# Patient Record
Sex: Female | Born: 1960 | Race: White | Hispanic: Yes | Marital: Married | Smoking: Never smoker
Health system: Southern US, Community
[De-identification: ages and names within clinical notes are randomized; demographics above are authoritative.]

## PROBLEM LIST (undated history)

## (undated) DIAGNOSIS — E7800 Pure hypercholesterolemia, unspecified: Secondary | ICD-10-CM

## (undated) DIAGNOSIS — E785 Hyperlipidemia, unspecified: Secondary | ICD-10-CM

## (undated) DIAGNOSIS — K5792 Diverticulitis of intestine, part unspecified, without perforation or abscess without bleeding: Secondary | ICD-10-CM

## (undated) DIAGNOSIS — I10 Essential (primary) hypertension: Secondary | ICD-10-CM

## (undated) DIAGNOSIS — K219 Gastro-esophageal reflux disease without esophagitis: Secondary | ICD-10-CM

## (undated) DIAGNOSIS — D219 Benign neoplasm of connective and other soft tissue, unspecified: Secondary | ICD-10-CM

## (undated) HISTORY — DX: Benign neoplasm of connective and other soft tissue, unspecified: D21.9

## (undated) HISTORY — PX: TUBAL LIGATION: SHX77

## (undated) HISTORY — DX: Gastro-esophageal reflux disease without esophagitis: K21.9

## (undated) HISTORY — DX: Essential (primary) hypertension: I10

## (undated) HISTORY — PX: HERNIA REPAIR: SHX51

---

## 1998-11-01 ENCOUNTER — Emergency Department (HOSPITAL_COMMUNITY): Admission: EM | Admit: 1998-11-01 | Discharge: 1998-11-01 | Payer: Self-pay | Admitting: Emergency Medicine

## 1999-02-04 ENCOUNTER — Emergency Department (HOSPITAL_COMMUNITY): Admission: EM | Admit: 1999-02-04 | Discharge: 1999-02-04 | Payer: Self-pay | Admitting: Emergency Medicine

## 1999-02-05 ENCOUNTER — Encounter: Payer: Self-pay | Admitting: Emergency Medicine

## 2001-09-12 ENCOUNTER — Ambulatory Visit (HOSPITAL_COMMUNITY): Admission: RE | Admit: 2001-09-12 | Discharge: 2001-09-12 | Payer: Self-pay | Admitting: Family Medicine

## 2002-07-01 ENCOUNTER — Encounter: Payer: Self-pay | Admitting: Internal Medicine

## 2002-07-01 ENCOUNTER — Ambulatory Visit (HOSPITAL_COMMUNITY): Admission: RE | Admit: 2002-07-01 | Discharge: 2002-07-01 | Payer: Self-pay | Admitting: Internal Medicine

## 2002-11-13 ENCOUNTER — Other Ambulatory Visit: Admission: RE | Admit: 2002-11-13 | Discharge: 2002-11-13 | Payer: Self-pay | Admitting: Family Medicine

## 2002-11-13 ENCOUNTER — Encounter: Admission: RE | Admit: 2002-11-13 | Discharge: 2002-11-13 | Payer: Self-pay | Admitting: Obstetrics and Gynecology

## 2002-11-13 ENCOUNTER — Encounter (INDEPENDENT_AMBULATORY_CARE_PROVIDER_SITE_OTHER): Payer: Self-pay | Admitting: Specialist

## 2003-01-02 ENCOUNTER — Encounter: Payer: Self-pay | Admitting: Internal Medicine

## 2003-01-02 ENCOUNTER — Ambulatory Visit (HOSPITAL_COMMUNITY): Admission: RE | Admit: 2003-01-02 | Discharge: 2003-01-02 | Payer: Self-pay | Admitting: Internal Medicine

## 2003-01-06 ENCOUNTER — Encounter: Payer: Self-pay | Admitting: Internal Medicine

## 2003-01-06 ENCOUNTER — Ambulatory Visit (HOSPITAL_COMMUNITY): Admission: RE | Admit: 2003-01-06 | Discharge: 2003-01-06 | Payer: Self-pay | Admitting: Internal Medicine

## 2003-02-12 ENCOUNTER — Other Ambulatory Visit: Admission: RE | Admit: 2003-02-12 | Discharge: 2003-02-12 | Payer: Self-pay | Admitting: *Deleted

## 2003-02-13 ENCOUNTER — Encounter: Admission: RE | Admit: 2003-02-13 | Discharge: 2003-02-13 | Payer: Self-pay | Admitting: *Deleted

## 2003-03-20 ENCOUNTER — Encounter: Admission: RE | Admit: 2003-03-20 | Discharge: 2003-03-20 | Payer: Self-pay | Admitting: Family Medicine

## 2003-08-07 ENCOUNTER — Encounter: Admission: RE | Admit: 2003-08-07 | Discharge: 2003-08-07 | Payer: Self-pay | Admitting: Family Medicine

## 2004-01-06 ENCOUNTER — Encounter: Admission: RE | Admit: 2004-01-06 | Discharge: 2004-01-06 | Payer: Self-pay | Admitting: Internal Medicine

## 2004-01-06 ENCOUNTER — Encounter (INDEPENDENT_AMBULATORY_CARE_PROVIDER_SITE_OTHER): Payer: Self-pay | Admitting: Specialist

## 2004-01-06 HISTORY — PX: BREAST BIOPSY: SHX20

## 2004-02-04 ENCOUNTER — Encounter: Admission: RE | Admit: 2004-02-04 | Discharge: 2004-02-04 | Payer: Self-pay | Admitting: Obstetrics and Gynecology

## 2004-02-17 ENCOUNTER — Ambulatory Visit (HOSPITAL_COMMUNITY): Admission: RE | Admit: 2004-02-17 | Discharge: 2004-02-17 | Payer: Self-pay | Admitting: Obstetrics and Gynecology

## 2004-03-04 ENCOUNTER — Ambulatory Visit (HOSPITAL_COMMUNITY): Admission: RE | Admit: 2004-03-04 | Discharge: 2004-03-04 | Payer: Self-pay | Admitting: Family Medicine

## 2005-06-09 ENCOUNTER — Ambulatory Visit: Payer: Self-pay | Admitting: Internal Medicine

## 2005-06-09 ENCOUNTER — Ambulatory Visit: Payer: Self-pay | Admitting: *Deleted

## 2005-06-29 ENCOUNTER — Encounter: Admission: RE | Admit: 2005-06-29 | Discharge: 2005-06-29 | Payer: Self-pay | Admitting: Internal Medicine

## 2006-07-05 ENCOUNTER — Emergency Department (HOSPITAL_COMMUNITY): Admission: EM | Admit: 2006-07-05 | Discharge: 2006-07-06 | Payer: Self-pay | Admitting: Emergency Medicine

## 2006-08-23 ENCOUNTER — Ambulatory Visit (HOSPITAL_COMMUNITY): Admission: RE | Admit: 2006-08-23 | Discharge: 2006-08-23 | Payer: Self-pay | Admitting: Surgery

## 2006-08-23 ENCOUNTER — Encounter (INDEPENDENT_AMBULATORY_CARE_PROVIDER_SITE_OTHER): Payer: Self-pay | Admitting: *Deleted

## 2008-01-08 ENCOUNTER — Encounter: Admission: RE | Admit: 2008-01-08 | Discharge: 2008-01-08 | Payer: Self-pay | Admitting: Sports Medicine

## 2008-01-17 ENCOUNTER — Encounter: Admission: RE | Admit: 2008-01-17 | Discharge: 2008-01-17 | Payer: Self-pay | Admitting: Geriatric Medicine

## 2008-06-23 ENCOUNTER — Encounter: Admission: RE | Admit: 2008-06-23 | Discharge: 2008-06-23 | Payer: Self-pay | Admitting: Geriatric Medicine

## 2009-06-04 ENCOUNTER — Encounter: Admission: RE | Admit: 2009-06-04 | Discharge: 2009-06-04 | Payer: Self-pay | Admitting: Geriatric Medicine

## 2010-12-24 ENCOUNTER — Encounter: Payer: Self-pay | Admitting: *Deleted

## 2010-12-25 ENCOUNTER — Encounter: Payer: Self-pay | Admitting: Internal Medicine

## 2010-12-25 ENCOUNTER — Encounter: Payer: Self-pay | Admitting: *Deleted

## 2010-12-26 ENCOUNTER — Encounter: Payer: Self-pay | Admitting: Geriatric Medicine

## 2011-04-21 NOTE — Group Therapy Note (Signed)
   NAME:  Sheila [name], Sheila [name] NO.:  [id number]   MEDICAL RECORD NO.:  [id number]                   PATIENT TYPE:  OUT   LOCATION:  [facility]                           FACILITY:  [facility]   PHYSICIAN:  Tinnie Gens, MD                     DATE OF BIRTH:  February 21, 1961   DATE OF SERVICE:  08/07/2003                                    CLINIC NOTE   CHIEF COMPLAINT:  Abnormal Pap.   HISTORY OF PRESENT ILLNESS:  The patient is a 50 year old Hispanic female  who has a history of ASCUS on a previous Pap with normal follow-up.  She  returns today for another six-month follow-up Pap.  On questioning, she does  have very irregular periods.  She had normal colpo biopsy and is being  followed with Paps every three months.  She has not had a mammogram this  year.   PHYSICAL EXAMINATION:  VITAL SIGNS:  Her blood pressure is 136/84, weight is  179.9.  GENITOURINARY:  She has normal external female genitalia.  The vagina is  rugated and clean.  The cervix is multiparous and without lesion.  On  bimanual exam the uterus is anteverted and 16-week size.  The adnexa are  without mass or tenderness.   IMPRESSION:  1. Abnormal Pap with normal follow-ups.  2. Health maintenance.   PLAN:  1. Pap smear today and six months.  2. Mammogram scheduled.                                               Tinnie Gens, MD    TP/MEDQ  D:  08/07/2003  T:  08/07/2003  Job:  [id number]

## 2011-04-21 NOTE — Op Note (Signed)
Sheila [name]    ACCOUNT NO.:  [id number]   MEDICAL RECORD NO.:  [id number]          PATIENT TYPE:  AMB   LOCATION:  DAY                          FACILITY:  [facility]   PHYSICIAN:  Sandria Bales. Ezzard Standing, M.D.  DATE OF BIRTH:  03/18/1961   DATE OF PROCEDURE:  08/23/2006  DATE OF DISCHARGE:                                 OPERATIVE REPORT   PREOPERATIVE DIAGNOSIS:  Umbilical hernia.   POSTOPERATIVE DIAGNOSIS:  Umbilical hernia, approximately 2 cm defect.   PROCEDURE:  Laparoscopic converted to open umbilical hernia repair.   SURGEON:  Sandria Bales. Ezzard Standing, M.D.   ASSISTANT:  None.   ANESTHESIA:  General with approximately 30 cc of 0.25% Marcaine.   COMPLICATIONS:  None.   INDICATIONS FOR PROCEDURE:  [name] is a 50 year old  Hispanic female who presented to the [facility] Emergency Room with  incarcerated umbilical hernia on July 05, 2006.  This hernia was reduced  and she was sent home, but she now returns for repair of her umbilical  hernia.  She is moderately obese.  It is a little bit hard to tell how large  this hernia is by CAT scan.  This hernia appeared large enough to  accommodate small bowel, which had incarcerated in it.  Therefore, I was  going to start laparoscopically, evaluate this hernia.  If the hernia  appeared to be 3 cm or bigger I would probably either do a laparoscopic or  ventral luxes.  If it is smaller I would probably do a primary repair.   Overall I explained to the patient through a translator, the indications and  potential complications.  The potential complications include:  bleeding,  infection and recurrence of the hernia.   OPERATIVE NOTE:  The patient was in the supine position with both arms  tucked; she was given 1 gram of Ancef at the initiation of the procedure.  The abdomen was prepped with Betadine solution and sterilely draped.   I accessed the abdominal cavity through the left upper quadrant, using an 11  mm  [employer] Optiview entered the abdominal cavity without difficulty.  Upon  abdominal exploration both lymph nodes and liver were unremarkable.  Stomach  was unremarkable.  I could see the umbilical hernia, it was fairly small  laparoscopically.  Therefore, I converted to an open procedure.   I removed the camera, desufflated the abdomen, entered through an  infraumbilical incision and cut down on a fairly large hernia sac (which  probably protruded some 5 or 6 cm), fouond the defect in the abdominal  fascia (was only about 1.5-2 cm in diameter).  I resected the sac, ligated  the base of the sac with 3-0 Vicryl suture; I then carried a repair of the  umbilical hernia with interrupted 0 Novofil sutures.   With the repair intact, I then re-laparoscoped the patient; there was no  evidence of air leak.  There was no evidence of bleeding.  I then  desufflated the abdomen and removed the trocar.   I then irrigated the abdomen, infiltrated approximately 30 cc of local  anesthetic -- both in the abdominal fascia and the left upper quadrant.  I  then reattached the umbilical skin to the fascia, enclosed the skin in  layers with 3-0 Vicryl suture and the skin with 5-0 Monocryl suture.  I  painted with tincture of Benzoin and Steri-Strips.   The patient tolerated the procedure well.  She was transported to the  recovery room in good condition.  Sponge and needle count were correct at  the end of the case.      Sandria Bales. Ezzard Standing, M.D.  Electronically Signed     DHN/MEDQ  D:  08/23/2006  T:  08/24/2006  Job:  [id number]

## 2011-04-21 NOTE — Consult Note (Signed)
Sheila Sheila [name], Sheila [name]   ACCOUNT NO.:  [id number]   MEDICAL RECORD NO.:  [id number]          PATIENT TYPE:  EMS   LOCATION:  [facility]                         FACILITY:  [facility]   PHYSICIAN:  Sheila Sheila [name], M.D.  DATE OF BIRTH:  [birth year]   DATE OF CONSULTATION:  DATE OF DISCHARGE:  07/06/2006                                   CONSULTATION   HISTORY OF ILLNESS:  This is a 50 year old Hispanic female who hails from  [place] and has no primary medical doctor and speaks very little English,  though in her room is her husband who actually understands and speaks  English very well.   She developed abdominal pain, came to the [facility] emergency room about  5:00 on August 2,2007 and was evaluated.  Apparently, there was some  confusion about what was causing actually her symptoms, but she underwent a  CT scan of her abdomen that showed an umbilical hernia with what appears to  a knuckle of bowel suggesting an incarceration.   Dr. Preston Fleeting called me at about 4:30 or 5:00.  I think that she was originally  seen by Dr. Carleene Cooper and then handed to Dr. Preston Fleeting, and Dr. Preston Fleeting called  me at 4:30 or 5:00 about her findings; however, he had been able to reduce  the umbilical hernia and she was feeling clearly better.  Of note, her white  blood count though which was first drawn at 8:00 on the 2nd of August was  17,800 with 91% neutrophils.   She denied any prior history of peptic ulcer disease, liver disease, colon  disease.  She had, had no prior abdominal surgery.   PAST MEDICAL HISTORY:  SHE HAS NO ALLERGIES.  She is on no medications.   REVIEW OF SYSTEMS:  As best I can tell from her husband,  NEUROLOGICALLY:  No history of seizures or loss of consciousness.  PULMONARY:  No history of pneumonia or tuberculosis.  CARDIAC:  No heart disease or chest pain.  GASTROINTESTINAL:  See history of present illness.  UROLOGIC:  No history of kidney stones or kidney infections.   She works in  [employer], I think maybe a [employer].   PHYSICAL EXAM:  VITAL SIGNS:  Her temperature is 97.4, pulse of 71, blood  pressure 105/64, respirations 18.  GENERAL:  She is a well-nourished, moderately obese Hispanic female.  Alert  and cooperative on physical exam.  HEENT:  Unremarkable.  NECK:  Supple.  I felt no mass or thyromegaly.  LUNGS:  Clear to auscultation.  HEART:  Regular rate and rhythm without murmur or rub.  ABDOMEN:  She has a defect at her umbilicus.  She may actually have some fat  cellulitis.  She seems to have about 2-3 cm defect at her umbilicus.  It was  a little hard to tell because she is obese, and she is a little bit tender  around the rim.  I feel no obvious incarcerated mass.  I think Dr. Preston Fleeting was  able to reduce this.  She has active bowel sounds.  She has no guarding or  rebound in any location.  I also do not feel  an inguinal hernia.  EXTREMITIES:  She has good strength in all four extremities.   LABS:  Her labs show a sodium of 137, potassium 4.6, chloride of 107.  Her  liver functions are normal.  Her lipase 21.  Her white blood count was  17,800.  We repeated that the morning of the 3rd.  It was about 18,000.  Her  hemoglobin was 12.9, hematocrit 38.3.  Her urine pregnancy was negative.   A CT scan that I reviewed with [facility] showed what does appear to be an  incarcerated hernia at the time of the CT scan.  She has no distended bowel  or evidence of bowel obstruction.  She also has what may be either a  gallbladder polyp or stone in her gallbladder, and she has what appears to  be a right inguinal hernia containing only fat.   IMPRESSION:  1. Umbilical hernia which has been reduced.  The patient is still tender      around the rim of the hernia and has no acute abdominal findings.  I am      assuming this white blood count may be due to the beat up bowel, but I      think it is probably okay to let her go home and set her up for       elective umbilical hernia repair.  I drew a diagram of the hernia for      the patient and her husband, again whom I though understood the surgery      well.   I discussed the indications and potential complications of surgery.  The  surgery may involve putting a piece of mesh in and usually is done as an  outpatient and can be set up in the next couple of weeks or if she has an  increase of her symptoms or problems.  She will be back in the emergency  room.  I did give her some Darvocet N100 for pain 20 tablets.  I wrote her a  note to be out of work for 3 days through the 7th of August.  I gave them  our phone number, and they are going to call our office and arrange to go by  to pick up a book on hernia repair and to get the hernia surgery scheduled.  1. Possible gallbladder polyp.  2. Right inguinal hernia  by CT only.  3. Moderately overweight.      Sheila Sheila [name], M.D.  Electronically Signed     DHN/MEDQ  D:  07/06/2006  T:  07/06/2006  Job:  [id number]

## 2011-04-21 NOTE — Group Therapy Note (Signed)
NAME:  Sheila [name], Sheila [name] NO.:  [id number]   MEDICAL RECORD NO.:  [id number]                   PATIENT TYPE:  OUT   LOCATION:  [facility]                           FACILITY:  [facility]   PHYSICIAN:  Silas Sacramento, MD                     DATE OF BIRTH:  1961-11-11   DATE OF SERVICE:  02/04/2004                                    CLINIC NOTE   CHIEF COMPLAINT:  A 30-month follow up Pap smear.   HISTORY OF PRESENT ILLNESS:  The patient is a 50 year old Hispanic female  history of ASCUS on previous Pap with a normal follow up returns for a 6-  month follow up Pap from September of 2004.  At that point in time Pap smear  was within normal limits.  Also of note since that time she had a mammogram  which by report showed a mass in her right breast which she subsequently had  biopsied in February 2005 which she states was fibrous in nature.  Also of  note, her last visit on bimanual exam uterus felt anteverted and at 16-week  size.   PHYSICAL EXAMINATION:  VITAL SIGNS:  As noted including blood pressure  139/89, pulse of 94.  GENERAL:  Alert Hispanic female, obese, in no acute distress.  ABDOMEN:  Soft, nontender, nondistended.  Fairly overweight.  GENITOURINARY:  Normal external female genitalia, vaginal mucosa is rugated  without lesions.  Cervix is multiparous without lesions or without cervical  motion tenderness.  A Pap smear is obtained.  Bimanual exam __________ ,  anteverted uterus and uterus appeared to be approximately 16 weeks size.  No  adnexal tenderness or apparent mass.   ASSESSMENT AND PLAN:  1. History of abnormal Pap smears with atypical squamous cells of     undetermined significance.  Also had a colposcopy in November 2003.  Has     had 2 normal Pap smears since that point in time.  Will obtain third Pap     smear today for further follow up.  2. Breast mass by report.  Apparently she has underwent mammography and     biopsy which was fibrous  tissue.  She is not being followed by [place] here.     Further workup per her other physician following this problem.  3. Enlarged uterus by bimanual exam.  By her report, she thinks she may have     had an ultrasound approximately a year ago.  Do not have a record of that     here today.  Will send her for a repeat ultrasound for other mass or     other cause of uterine enlargement.  Of note her menstrual history, she     does get 40 days in between each menses and is approximately 6 days in     duration with the first 3 days moderate flow, last 3 days light flow.  Follow up of this patient will be in approximately 2 to 4 weeks or sooner     as needed.  Of note her borderline blood pressure, she does have family     history.  We will recheck this next office visit.  Follow up as soon as     needed.                                               Silas Sacramento, MD    JG/MEDQ  D:  02/04/2004  T:  02/04/2004  Job:  [id number]

## 2012-04-11 ENCOUNTER — Other Ambulatory Visit: Payer: Self-pay | Admitting: Obstetrics and Gynecology

## 2012-04-11 DIAGNOSIS — Z1231 Encounter for screening mammogram for malignant neoplasm of breast: Secondary | ICD-10-CM

## 2012-04-17 ENCOUNTER — Ambulatory Visit (HOSPITAL_COMMUNITY): Payer: Self-pay | Attending: Obstetrics and Gynecology

## 2012-04-17 ENCOUNTER — Encounter: Payer: Self-pay | Admitting: Obstetrics and Gynecology

## 2012-04-17 ENCOUNTER — Other Ambulatory Visit (HOSPITAL_COMMUNITY)
Admission: RE | Admit: 2012-04-17 | Discharge: 2012-04-17 | Disposition: A | Discharge status: Home / Self Care | Payer: Self-pay | Source: Ambulatory Visit | Attending: Obstetrics and Gynecology | Admitting: Obstetrics and Gynecology

## 2012-04-17 ENCOUNTER — Ambulatory Visit (INDEPENDENT_AMBULATORY_CARE_PROVIDER_SITE_OTHER): Payer: Self-pay | Admitting: Obstetrics and Gynecology

## 2012-04-17 VITALS — BP 140/90 | HR 86 | Temp 98.1°F

## 2012-04-17 DIAGNOSIS — N939 Abnormal uterine and vaginal bleeding, unspecified: Secondary | ICD-10-CM | POA: Insufficient documentation

## 2012-04-17 DIAGNOSIS — I10 Essential (primary) hypertension: Secondary | ICD-10-CM | POA: Insufficient documentation

## 2012-04-17 DIAGNOSIS — N926 Irregular menstruation, unspecified: Secondary | ICD-10-CM

## 2012-04-17 NOTE — Progress Notes (Signed)
  Subjective:    Patient ID: Sheila [name], female    DOB: [birth year], 51 y.o.   MRN: [id number]  HPI  51 yo [id number] with LMP 03/29/2012 presenting today for evaluation of heavy vaginal bleeding. Patient reports an 52-month h/o of heavy vaginal bleeding. Patient reports menses being monthly and lasting 6 days previously but now they last 10-12 days. Patient is otherwise without complaints and denies CP, SOB, lightheadedness or dizziness. Patient reports that she was seen by a doctor (not within [facility]) and had blood work and an ultrasound performed which showed fibroid uterus.   Past Medical History  Diagnosis Date  . Hypertension   . Fibroids    Past Surgical History  Procedure Date  . Cesarean section   . Hernia repair   . Tubal ligation    Family History  Problem Relation Age of Onset  . Diabetes Sister   . Diabetes Brother   . Hypertension Sister    History  Substance Use Topics  . Smoking status: Never Smoker   . Smokeless tobacco: Never Used  . Alcohol Use: No     Review of Systems  All other systems reviewed and are negative.       Objective:   Physical Exam GENERAL: Well-developed, well-nourished female in no acute distress.  ABDOMEN: Soft, nontender, nondistended. No organomegaly. obese PELVIC: Normal external female genitalia. Vagina is pink and rugated.  Normal discharge. Normal appearing cervix. Bimanual exam limited due to body habitus. EXTREMITIES: No cyanosis, clubbing, or edema, 2+ distal pulses.     Assessment & Plan:  51 yo [phone] with abnormal uterine bleeding - Endometrial biopsy performed  ENDOMETRIAL BIOPSY     The indications for endometrial biopsy were reviewed.   Risks of the biopsy including cramping, bleeding, infection, uterine perforation, inadequate specimen and need for additional procedures  were discussed. The patient states she understands and agrees to undergo procedure today. Consent was signed. Time out was  performed. Urine HCG was negative. A sterile speculum was placed in the patient's vagina and the cervix was prepped with Betadine. A single-toothed tenaculum was placed on the anterior lip of the cervix to stabilize it. The uterine cavity was sounded to a depth of 9 cm using the uterine sound. The 3 mm pipelle was introduced into the endometrial cavity without difficulty, 2 passes were made.  A  moderate amount of tissue was  sent to pathology. The instruments were removed from the patient's vagina. Minimal bleeding from the cervix was noted. The patient tolerated the procedure well.  Routine post-procedure instructions were given to the patient. The patient will follow up in two weeks to review the results and for further management.   - Will obtain records from previous MD office - RTC in 2 weeks for results and further management

## 2012-05-17 ENCOUNTER — Ambulatory Visit (INDEPENDENT_AMBULATORY_CARE_PROVIDER_SITE_OTHER): Payer: Self-pay | Admitting: Advanced Practice Midwife

## 2012-05-17 ENCOUNTER — Encounter: Payer: Self-pay | Admitting: Advanced Practice Midwife

## 2012-05-17 VITALS — BP 123/80 | HR 90 | Temp 98.1°F | Ht 60.0 in | Wt 170.9 lb

## 2012-05-17 DIAGNOSIS — K5900 Constipation, unspecified: Secondary | ICD-10-CM

## 2012-05-17 DIAGNOSIS — R141 Gas pain: Secondary | ICD-10-CM

## 2012-05-17 DIAGNOSIS — N920 Excessive and frequent menstruation with regular cycle: Secondary | ICD-10-CM

## 2012-05-17 LAB — CBC
Hemoglobin: 11.7 g/dL — ABNORMAL LOW (ref 12.0–15.0)
MCH: 26.8 pg (ref 26.0–34.0)
MCV: 80.5 fL (ref 78.0–100.0)
RBC: 4.36 MIL/uL (ref 3.87–5.11)
WBC: 8.6 10*3/uL (ref 4.0–10.5)

## 2012-05-17 MED ORDER — MEDROXYPROGESTERONE ACETATE 10 MG PO TABS: 10.0000 mg | ORAL_TABLET | Freq: Every day | ORAL | Status: DC

## 2012-05-17 NOTE — Progress Notes (Signed)
  Subjective:    Patient ID: Sheila [name], female    DOB: 05-31-1961, 51 y.o.   MRN: [id number]  HPI: F/U visit for EBX results. No bleeding now. Also reports left mid abd pain, gradual onset, now 8/10. Feels like gas pains. Has not tried anything for Tx. Has had problems w/ constipation. Last BM today. Denies fever, chills, N/V/D. Normal appetite.     Review of Systems: Otherwise neg.     Objective:   Physical Exam:  BP 123/80  Pulse 90  Temp 98.1 F (36.7 C) (Oral)  Ht 5' (1.524 m)  Wt 77.52 kg (170 lb 14.4 oz)  BMI 33.38 kg/m2  LMP 04/28/2012 NAD Abd: Soft, NT, +BS x 4  EBX normal    Assessment & Plan:   1. Menorrhagia  medroxyPROGESTERone (PROVERA) 10 MG tablet, CBC  2. Constipation    3. Gas pain     May take Gas-X, Miralax until soft, daily BM's.  Discussed dietary changes to Tx and prevent constipation Discussed Tx options for menorrhagia. Will try Provera is Sx recur.  Bleeding precautions   Dorathy Kinsman, CNM 05/17/2012 12:07 PM

## 2012-05-17 NOTE — Patient Instructions (Signed)
Miralax for constipation. Gas_ for gas.  Constipacin en los adultos (Constipation in Adults) Se denomina constipacin al hecho de mover el intestino menos de dos veces por semana. Generalmente las heces son duras. A medida que envejecemos, la constipacin es ms frecuente. Si trata de solucionarlo con laxantes, puede empeorar el problema. Los laxantes utilizados durante largos perodos pueden [employer] msculos del colon. Esto har que la constipacin empeore gradualmente. Una dieta pobre en fibras, la ingesta insuficiente de lquidos y algunos medicamentos pueden empeorar el problema. ALGUNOS MEDICAMENTOS QUE PUEDEN CAUSAR CONSTIPACIN SON:  Diurticos.   Boqueadotes de los canales de calcio (medicamento utilizado para Chief Operating Officer la presin arterial y el funcionamiento cardaco.   Narcticos (ciertos medicamentos para Chief Technology Officer).   Anticolinrgicos.   Antiinflamatorios.   Anticidos que contengan aluminio.  ALGUNOS MEDICAMENTOS QUE PUEDEN CAUSAR CONSTIPACIN SON:   Diabetes.   Enfermedad de Parkinson.   Demencia (la mente no funciona adecuadamente y existen trastornos del pensamiento).   Ictus.   Depresin.   Otras enfermedades que causan dificultades con el metabolismo de sales y lquidos.  INSTRUCCIONES PARA EL CUIDADO DOMICILIARIO  El mejor tratamiento para la constipacin es el que se realiza sin tomar medicamentos. Es importante consumir ms fibras, frutas y Sports administrator.   Aumente lentamente el consumo de fibras a 25 a 38 gramos por da. Granos integrales, frutas, verduras y legumbres son buenas fuentes de [place]. Un dietista podr ayudarlo a incorporar alimentos altos en fibra en su dieta.   Beba gran cantidad de lquido para mantener la orina de tono claro o color amarillo plido.   Deber aadir un suplemento de fibra en su dieta si no puede recibir la cantidad suficiente a partir de los alimentos.   Aumentar la actividad fsica tambin ayuda a mejorar la  regularidad.   Los supositorios, segn lo haya indicado el mdico, podrn ser de utilidad. Si toma anticidos u otros productos que contengan aluminio o calcio, los que pueden causar constipacin, ser de gran ayuda cambiarlos por productos que contengan magnesio, con la aprobacin del mdico.   Si en el da de hoy le han aplicado un enema, considere que esta slo es una medida temporaria y no debe considerarse como tratamiento para la constipacin de Set designer data (crnica). Si utiliza enemas [employer], se debilitarn los msculos del colon y la Quarry manager.   Tambin deben evitarse en lo posible medidas ms enrgicas, como el consumo de sulfato de magnesio, siempre que sea posible. El sulfato de magnesio puede causar diarrea incontrolable. Sin embargo, si usted es una persona de edad [place], estas medidas pueden ser tentadoras. En algunos casos, este tipo de medidas ni siquiera le dan tiempo para llegar al bao.  SOLICITE ATENCIN MDICA DE INMEDIATO SI:  Observa sangre de color rojo brillante en las heces.   El estreimiento persiste durante ms de [employer]   Presenta dolor abdominal o rectal junto con el estreimiento.   No parece sentirse mejor.   Tiene preguntas para formular, o alguna preocupacin, o no mejora.  EST SEGURO QUE:   Comprende las instrucciones para el alta mdica.   Controlar su enfermedad.   Solicitar atencin mdica de inmediato segn las indicaciones.  Document Released: 12/10/2007 Document Revised: 11/09/2011 [facility] Patient Information 2012 [place], [place].  Menopausia (Menopause) La menopausia es el momento normal de la vida en que los perodos menstruales cesan completamente. Se considera definitiva cuando no ha habido perodos durante 12 meses consecutivos. Generalmente ocurre [employer] 48 y los 55  aos, y el promedio son los 51 aos. En muy raras ocasiones se produce antes de los 40 aos. En [employer], los ovarios dejan de  producir hormonas femeninas, estrgenos y Education officer, museum. Esto puede causar sntomas indeseables y Engineer, maintenance. En algunos casos los sntomas pueden ocurrir entre 4 a 5 aos antes del comienzo de la menopausia. No hay relacin entre el uso de anticonceptivos orales, el nmero de hijos que ha tenido, la raza o la edad en que los perodos menstruales comenzaron [place]). Las mujeres muy fumadoras y las muy delgadas pueden desarrollarla precozmente. CAUSAS  Los ovarios dejan de producir hormonas femeninas, estrgenos y Education officer, museum.   Otras causas son:   [place] en la que se extirpan ambos ovarios.   Los ovarios dejan de funcionar por causa desconocida.   Tumores en la glndula pituitaria, ubicada en el cerebro.   Enfermedades que [employer] ovarios y la produccin de hormonas.   Tratamiento de radioterapia en el abdomen o en la pelvis.   Quimioterapia que [employer].  SNTOMAS  Sofocos.   Sudoracin nocturna.   Disminucin del impulso sexual.   Sequedad vaginal y disminucin del tamao de los rganos genitales, lo que causa relaciones sexuales dolorosas.   Sequedad de la piel y aparicin de Banker.   Cefaleas.   Cansancio.   Irritabilidad.   Problemas de memoria.   Aumento de [place].   Infecciones urinarias.   Crecimiento del vello en el rostro y [place].   Infertilidad.  Otros sintomas ms graves son:  Prdida de masa sea (osteoporosis), lo que causa fracturas de huesos.   Depresin.   Endurecimiento y Scientist, research (medical) de las arterias (aterosclerosis), lo que puede ocasionar infartos e ictus.  DIAGNSTICO  Cuando el perodo menstrual falta durante 12 meses corridos.   Exmenes fsicos   Estudios hormonales de [place].  TRATAMIENTO Hay muchas opciones de tratamiento y casi tantas preguntas como opciones existen. La decisin de tratar o no los cambios que trae la menopausia es una decisin que realiza el profesional de acuerdo con cada  Marketing executive. El mdico comentar el tratamiento con usted. Juntos pueden decidir que tratamiento ser el mejor, por ejemplo:  Tratamiento de reemplazo hormonal.   Tratamiento de los sntomas individuales con medicamentos (por ejemplo tranquilizantes para la depresin).   Hierbas que pueden ayudar en algunos sntomas especficos.   Psicoterapia con un psiquiatra o un psiclogo.   Terapia grupal.   No recibir tratamiento.  INSTRUCCIONES PARA EL CUIDADO DOMICILIARIO  Tome los medicamentos segn las indicaciones.   Descanse y duerma lo suficiente.   Practicar ejercicios con regularidad.   Consuma una dieta rica en calcio (buena para los [place]) y soja (acta como un estrgeno).   Evite las bebidas alcohlicas.   No fume.   El consumo de vitamina E puede ayudar en ciertos casos.   Si tiene sofocos, vstase en capas.   Tome suplementos de calcio y vitamina D para fortalecer los [place].   Puede usar cremas de venta libre para la sequedad vaginal.   En algunos casos es de gran ayuda la terapia grupal.   Tambin puede ser de utilidad la acupuntura.  SOLICITE ANTENCIN MDICA SI:  No est segura de estar cursando la menopausia.   Tiene los sntomas y necesita consejo y [place].   Tiene perodos menstruales despus de los 55 aos.   Tiene dolor durante las [employer] [place] [employer].   Est en la menopausia (no ha tenido perodos menstruales durante 12 meses) y  desarrolla una hemorragia vaginal.   Necesita ser derivada a un especialista (gineclogo, psiquiatra o psiclogo) para Pensions consultant.  SOLICITE ATENCIN MDICA DE INMEDIATO SI:  Sufre una depresin severa.   Tiene una hemorragia vaginal abundante.   Se ha cado y piensa que se ha roto un hueso.   Siente dolor al [employer].   Siente dolor en el pecho o en la pierna.   Siente latidos cardacos acelerados (palpitaciones)   Tiene dolores de cabeza intensos.   Desarrolla trastornos visuales.    Siente un bulto en el pecho.   Tiene dolor abdominal, o sufre una indigestin grave.  Document Released: 01/01/2007 Document Revised: 11/09/2011 [facility] Patient Information 2012 [place], [place].  Menorragia (Menorrhagia) La hemorragia uterina (sangrado del tero) disfuncional es diferente del perodo menstrual normal. Cuando los perodos son irregulares o hay ms hemorragia que lo habitual en usted, se denomina menorragia. La causa puede ser un desequilibrio hormonal, fsico o metablico u otros problemas. Es necesario Medical sales representative examen para que el profesional pueda tratar [employer] causas que son tratables. Si el problema contina, ser necesario realizar una dilatacin y curetaje. Este procedimiento [employer] en dilatar el cuello del tero (la apertura del tero o matriz), es decir, se lo estira para Technical brewer, y se raspa la superficie que tapiza el interior del tero. Un especialista (patlogo) examina en el microscopio el tejido que se retira para asegurarse que no haya nada preocupante que requiera un examen ms exhaustivo. INSTRUCCIONES PARA EL CUIDADO DOMICILIARIO  Si el profesional que la asiste le prescribe medicamentos, tmelos tal como se le indic. No cambie ni reemplace medicamentos sin consultarlo con Mining engineer.   Las hemorragias de larga duracin pueden tener como consecuencia un dficit de hierro. El profesional que la asiste podr prescribirle comprimidos de hierro. Esto ayuda a Scientific laboratory technician que el organismo pierde luego de una hemorragia abundante. Tome los medicamentos tal como se le indic. El hierro puede causarle estreimiento. Si esto es un problema, aumente el consumo de [place], frutas y [place].   No tome aspirina o medicamentos que la contengan desde una semana antes del perodo menstrual ni durante el mismo. La aspirina puede hacer que la hemorragia empeore.   Si necesita cambiar el apsito o el tampn mas de una vez cada 2 horas, permanezca en cama y  descanse todo lo posible hasta que la hemorragia se detenga.   Consuma alimentos balanceados. Coma alimentos ricos en hierro. Por ejemplo, verduras de [employer], carne, hgado, huevos y panes y Medical laboratory scientific officer de grano integral. No trate de perder peso hasta que la hemorragia anormal se detenga y los niveles de hierro en la sangre vuelvan a la normalidad.  SOLICITE ATENCIN MDICA SI:  Debe cambiar el apsito o el tampn ms de una vez cada hora.   Si tiene nuseas (ganas de vomitar), vmitos, mareos o diarrea mientras toma los medicamentos.   Tiene algn problema que pueda relacionarse con el medicamento que est tomando.  SOLICITE ATENCIN MDICA DE INMEDIATO SI:  Tiene fiebre.   Siente escalofros.   Sufre una hemorragia intensa o comienza a eliminar cogulos de [place].   Se siente mareado o sufre un desmayo.  EST SEGURO QUE:   Comprende las instrucciones para el alta mdica.   Controlar su enfermedad.   Solicitar atencin mdica de inmediato segn las indicaciones.  Document Released: 08/30/2005 Document Revised: 11/09/2011 [facility] Patient Information 2012 [place], [place].

## 2014-10-05 ENCOUNTER — Encounter: Payer: Self-pay | Admitting: Advanced Practice Midwife

## 2015-04-27 ENCOUNTER — Other Ambulatory Visit (HOSPITAL_COMMUNITY): Payer: Self-pay | Admitting: *Deleted

## 2015-04-27 DIAGNOSIS — N6310 Unspecified lump in the right breast, unspecified quadrant: Secondary | ICD-10-CM

## 2015-05-20 ENCOUNTER — Ambulatory Visit
Admission: RE | Admit: 2015-05-20 | Discharge: 2015-05-20 | Disposition: A | Discharge status: Home / Self Care | Payer: No Typology Code available for payment source | Source: Ambulatory Visit | Attending: Obstetrics and Gynecology | Admitting: Obstetrics and Gynecology

## 2015-05-20 ENCOUNTER — Encounter (HOSPITAL_COMMUNITY): Payer: Self-pay

## 2015-05-20 ENCOUNTER — Ambulatory Visit (HOSPITAL_COMMUNITY)
Admission: RE | Admit: 2015-05-20 | Discharge: 2015-05-20 | Disposition: A | Discharge status: Home / Self Care | Payer: No Typology Code available for payment source | Source: Ambulatory Visit | Attending: Obstetrics and Gynecology | Admitting: Obstetrics and Gynecology

## 2015-05-20 VITALS — BP 120/84 | Temp 98.4°F | Ht 62.0 in | Wt 181.0 lb

## 2015-05-20 DIAGNOSIS — Z1239 Encounter for other screening for malignant neoplasm of breast: Secondary | ICD-10-CM

## 2015-05-20 DIAGNOSIS — N6310 Unspecified lump in the right breast, unspecified quadrant: Secondary | ICD-10-CM

## 2015-05-20 DIAGNOSIS — N63 Unspecified lump in breast: Secondary | ICD-10-CM | POA: Insufficient documentation

## 2015-05-20 DIAGNOSIS — Z124 Encounter for screening for malignant neoplasm of cervix: Secondary | ICD-10-CM | POA: Insufficient documentation

## 2015-05-20 DIAGNOSIS — Z1231 Encounter for screening mammogram for malignant neoplasm of breast: Secondary | ICD-10-CM | POA: Insufficient documentation

## 2015-05-20 NOTE — Progress Notes (Signed)
CLINIC:  [facility] Passenger transport manager) Clinic  REASON FOR VISIT: Well-woman exam and diagnostic mammogram.    HISTORY OF PRESENT ILLNESS:  Ms. Shawgo is a 54 y.o. female who presents to the [facility] today for clinical breast exam. She reports a right breast lump x 8 years at the 10 o'clock position, near the nipple, that she reports is painful at times.  She denies any redness or nipple discharge from the right breast.  Her last pap smear was in 01/2015 and was negative.  She has no history of abnormal pap smears.   REVIEW OF SYSTEMS:  Denies any left breast pain, nodularity, nipple inversion, or nipple discharge bilaterally.  Right breast per HPI.   ALLERGIES: No Known Allergies  CURRENT MEDICATIONS:  Current Outpatient Prescriptions on File Prior to Encounter  Medication Sig Dispense Refill  . ibuprofen (ADVIL,MOTRIN) 200 MG tablet Take 200 mg by mouth every 6 (six) hours as needed.    Marland Kitchen lisinopril-hydrochlorothiazide (PRINZIDE,ZESTORETIC) 10-12.5 MG per tablet Take 1 tablet by mouth daily.    . medroxyPROGESTERone (PROVERA) 10 MG tablet Take 1 tablet (10 mg total) by mouth daily. 10 tablet 1   No current facility-administered medications on file prior to encounter.     PHYSICAL EXAM:  Vitals:  Filed Vitals:   05/20/15 1029  BP: 120/84  Temp: 98.4 F (36.9 C)   General: Well-nourished, well-appearing female in no acute distress.  She is unaccompanied in clinic today.  Rolena Infante, LPN and Tamsen Snider, Spanish language interpreter were present during physical exam for this patient.  Breasts: Bilateral breasts exposed and observed with patient standing (arms at side, arms on hips, arms on hips flexed forward, and arms over head).  No gross abnormalities including breast skin puckering or dimpling noted on observation.  Breasts symmetrical without evidence of skin redness, thickening, or peau d'orange appearance. No nipple retraction or nipple  discharge noted bilaterally.  No breast nodularity palpated in bilateral breasts.  Normal fibrocystic breast changes noted in bilateral breasts. Axillary lymph nodes: No axillary lymphadenopathy bilaterally.   GU: Exam deferred. Pap smear is up-to-date.  ASSESSMENT & PLAN:   1. Breast cancer screening: Ms. Picazo has no palpable breast abnormalities on her clinical breast exam today.  Likely the lump she has been feeling is normal fibrocystic changes or normal breast tissue.  She will receive her diagnostic mammogram as scheduled.  She will be contacted by the imaging center for results of the mammogram. She was given instructions and educational materials regarding breast self-awareness. Ms. Stewart is aware of this plan and agrees with it.    Ms. Vicars was encouraged to ask questions and all questions were answered to her satisfaction.    Mike Craze, NP [place]  [phone]

## 2015-06-03 ENCOUNTER — Ambulatory Visit: Payer: No Typology Code available for payment source

## 2015-06-03 ENCOUNTER — Other Ambulatory Visit: Payer: No Typology Code available for payment source

## 2015-06-03 ENCOUNTER — Telehealth: Payer: Self-pay

## 2015-06-03 NOTE — Telephone Encounter (Signed)
Interpreter Mallory Shirk called patient to find out why missed appointment. Patient stated that did not have transportation because only had one car. Patient stated that daughter had not returned with the car. Patient wanted to reschedule for Thursday, July 7 at 9:30AM.

## 2015-06-10 ENCOUNTER — Other Ambulatory Visit: Payer: Self-pay

## 2015-06-10 ENCOUNTER — Ambulatory Visit (HOSPITAL_BASED_OUTPATIENT_CLINIC_OR_DEPARTMENT_OTHER): Payer: Self-pay

## 2015-06-10 VITALS — BP 150/98 | HR 64 | Temp 98.3°F | Resp 18 | Ht 59.5 in | Wt 183.2 lb

## 2015-06-10 DIAGNOSIS — Z0000 Encounter for general adult medical examination without abnormal findings: Secondary | ICD-10-CM

## 2015-06-10 LAB — LIPID PANEL
Cholesterol: 239 mg/dL — ABNORMAL HIGH (ref 0–200)
HDL: 50 mg/dL (ref >=46)
LDL Cholesterol: 160 mg/dL — ABNORMAL HIGH (ref 0–99)
Total CHOL/HDL Ratio: 4.8 Ratio
Triglycerides: 145 mg/dL (ref <150)
VLDL: 29 mg/dL (ref 0–40)

## 2015-06-10 LAB — GLUCOSE (CC13): GLUCOSE: 93 mg/dL (ref 70–140)

## 2015-06-10 LAB — HEMOGLOBIN A1C
Hgb A1c MFr Bld: 6 % — ABNORMAL HIGH (ref <5.7)
Mean Plasma Glucose: 126 mg/dL — ABNORMAL HIGH (ref <117)

## 2015-06-10 NOTE — Progress Notes (Signed)
Patient is a new patient to the [facility] program and is currently a [facility] patient effective 05/20/2015 and a interpreter did not show up. Patient speaks a little english.   Clinical Measurements: Patient is 4 ft. 11 1/2 inches, weight 183.2 lbs, waist circumference 38 inches, and hip circumference 45.5 inches.   Medical History: Patient states that has no history of high cholesterol or diabetes. Per patient does have a history of hypertension. Per patient no diagnosed history of coronary heart disease, heart attack, heart failure, stroke/TIA, vascular disease or congenital heart defects.   Blood Pressure, Self-measurement: Patient states has not been told to check Blood pressure.  Nutrition Assessment: Patient stated that eats 3 to 4 fruits every day. Patient states she eats 4 servings of vegetables a day. Per patient states does eat 3 or more ounces of whole grains daily. Patient stated does eat two or more servings of fish weekly. Patient states she does not drink more than 36 ounces or 450 calories of beverages with added sugars weekly. Patient stated she does watch her salt intake. 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 Patient states that walks trails usually but has been too hot.Patient does around 1860 minutes of moderate exercise a week and rarely does any vigorous exercise.  Smoking Status: Patient has never smoked and is not exposed to smoke.   Quality of Life Assessment: In assessing patient's quality of life she stated that out of the past 30 days that she has felt her health was good all of them. Patient also stated that in the past 30 days that her mental health was not good including stress, depression and problems with emotions for all days. Patient said that was going through menopause and was stressed and emotional. Patient did state that out of the past 30 days she felt her physical or mental health had not  kept her from doing her usual activities including self-care, work or recreation.   Plan: Lab work will be done today including a lipid panel, blood glucose, and Hgb A1C. Will call lab results when they are finished. Will discuss risk reduction counseling when call results.Will work on reducing risk factors associated with high blood pressure.

## 2015-06-10 NOTE — Patient Instructions (Signed)
Discussed health assessment with patient. Informed patient that she would need to be followed up for  blood pressure. Will try and get appointment on a Friday afternoon. She will be called with results of lab work and we will then discussed any further follow up the patient needs and risk reduction counseling. Patient will implement behavior modifications to help lower BP. Patient verbalized understanding.

## 2015-06-15 ENCOUNTER — Telehealth: Payer: Self-pay

## 2015-06-15 NOTE — Telephone Encounter (Signed)
Not available Appears phones are out of service. Will try later.

## 2015-06-16 ENCOUNTER — Encounter (HOSPITAL_COMMUNITY): Payer: Self-pay | Admitting: Emergency Medicine

## 2015-06-16 ENCOUNTER — Emergency Department (INDEPENDENT_AMBULATORY_CARE_PROVIDER_SITE_OTHER)
Admission: EM | Admit: 2015-06-16 | Discharge: 2015-06-16 | Disposition: A | Discharge status: Home / Self Care | Payer: Self-pay | Source: Home / Self Care

## 2015-06-16 ENCOUNTER — Telehealth: Payer: Self-pay

## 2015-06-16 DIAGNOSIS — J302 Other seasonal allergic rhinitis: Secondary | ICD-10-CM

## 2015-06-16 DIAGNOSIS — R040 Epistaxis: Secondary | ICD-10-CM

## 2015-06-16 HISTORY — DX: Pure hypercholesterolemia, unspecified: E78.00

## 2015-06-16 MED ORDER — IPRATROPIUM BROMIDE 0.06 % NA SOLN: 2.0000 | Freq: Four times a day (QID) | NASAL | Status: DC

## 2015-06-16 NOTE — Telephone Encounter (Signed)
Husband called to ask about lab and doctors appointment because interpreter did not return to assist. Husband stated that did not have anything to write with. I asked would he like me to text it to them? He stated that would be great.I texted the following in spanish: cholesterol- 239, HDL- 50, LDL- 160, triglycerides - 145, Bld Glucose -93 and HBG-A1C - 6.0.  Informed patient that she had an appointment at [facility] on Friday, July 22 at 11 AM. Also, told patient that she needed to make appointment with me at [facility] about what to do about results.

## 2015-06-16 NOTE — ED Notes (Signed)
Pt states she feels like she has something in her throat and when she clears it she is bringing up a little blood.  She also reports having a nose bleed.  All this started today.  She also wanted to have her blood pressure checked.  Pt says she feels stressed and nervous.

## 2015-06-16 NOTE — Discharge Instructions (Signed)
Your symptoms are likely due to nasal irritation from chronic ongoing allergies and sinus congestion. Please start using nasal saline multiple times per day. Please start using it nightly allergy pill such as Zyrtec. Please use the nasal Atrovent as prescribed.

## 2015-06-16 NOTE — ED Provider Notes (Signed)
CSN: [id number]     Arrival date & time 06/16/15  1359 History   None    Chief Complaint  Patient presents with  . Epistaxis  . Hemoptysis   (Consider location/radiation/quality/duration/timing/severity/associated sxs/prior Treatment) HPI  Woke up this morning with a bloody nose. Bled for only  3-5 mninutes. Pressure w/ improvement. 4 mo ago developed bloody nose. No use of nasal inhalers. H/o snus "problems:" Denies CP, SOB, Palpitations, HA, syncopr.    Past Medical History  Diagnosis Date  . High cholesterol    Past Surgical History  Procedure Laterality Date  . Cesarean section    . Hernia repair     Family History  Problem Relation Age of Onset  . Hyperlipidemia Sister   . Hypertension Sister    History  Substance Use Topics  . Smoking status: Never Smoker   . Smokeless tobacco: Never Used  . Alcohol Use: No   OB History    No data available     Review of Systems Per HPI with all other pertinent systems negative.    Allergies  Acetaminophen  Home Medications   Prior to Admission medications   Not on File   BP 148/88 mmHg  Pulse 76  Temp(Src) 98.2 F (36.8 C) (Oral)  Resp 18  SpO2 100% Physical Exam Physical Exam  Constitutional: oriented to person, place, and time. appears well-developed and well-nourished. No distress.  HENT:  Boggy nasal turbinates, oropharyngeal cavity normal. TMs normal bilaterally.  Head: Normocephalic and atraumatic.  Eyes: EOMI. PERRL.  Neck: Normal range of motion.  Cardiovascular: RRR, no m/r/g, 2+ distal pulses,  Pulmonary/Chest: Effort normal and breath sounds normal. No respiratory distress.  Abdominal: Soft. Bowel sounds are normal. NonTTP, no distension.  Musculoskeletal: Normal range of motion. Non ttp, no effusion.  Neurological: alert and oriented to person, place, and time.  Skin: Skin is warm. No rash noted. non diaphoretic.  Psychiatric: normal mood and affect. behavior is normal. Judgment and thought  content normal.   ED Course  Procedures (including critical care time) Labs Review Labs Reviewed - No data to display  Imaging Review No results found.   MDM   1. Epistaxis   2. Seasonal allergies    Zyrtec, nasal saline, nasal Atrovent. No use of Flonase indicated at this point time due to current epistaxis. Patient to consider using this in the future.    Waldemar Dickens, MD 06/16/15 [phone]

## 2015-06-17 ENCOUNTER — Telehealth: Payer: Self-pay

## 2015-06-17 NOTE — Telephone Encounter (Addendum)
Called per interpreter Zenda Alpers to see if patient wanted health coaching. Patient signed up for 2 PM on July 29. Will do [facility].

## 2015-06-25 ENCOUNTER — Ambulatory Visit (INDEPENDENT_AMBULATORY_CARE_PROVIDER_SITE_OTHER): Payer: Self-pay | Admitting: Family Medicine

## 2015-06-25 ENCOUNTER — Encounter: Payer: Self-pay | Admitting: Family Medicine

## 2015-06-25 VITALS — BP 152/96 | HR 85 | Temp 98.3°F | Ht 59.0 in | Wt 180.5 lb

## 2015-06-25 DIAGNOSIS — R0981 Nasal congestion: Secondary | ICD-10-CM | POA: Insufficient documentation

## 2015-06-25 DIAGNOSIS — I10 Essential (primary) hypertension: Secondary | ICD-10-CM

## 2015-06-25 DIAGNOSIS — E785 Hyperlipidemia, unspecified: Secondary | ICD-10-CM | POA: Insufficient documentation

## 2015-06-25 DIAGNOSIS — R7309 Other abnormal glucose: Secondary | ICD-10-CM

## 2015-06-25 DIAGNOSIS — R7303 Prediabetes: Secondary | ICD-10-CM | POA: Insufficient documentation

## 2015-06-25 MED ORDER — ASPIRIN EC 81 MG PO TBEC: 81.0000 mg | DELAYED_RELEASE_TABLET | Freq: Every day | ORAL | Status: DC

## 2015-06-25 MED ORDER — FLUTICASONE PROPIONATE 50 MCG/ACT NA SUSP: 1.0000 | Freq: Every day | NASAL | Status: DC

## 2015-06-25 MED ORDER — LISINOPRIL 10 MG PO TABS: 10.0000 mg | ORAL_TABLET | Freq: Every day | ORAL | Status: DC

## 2015-06-25 MED ORDER — ATORVASTATIN CALCIUM 40 MG PO TABS
40.0000 mg | ORAL_TABLET | Freq: Every day | ORAL | Status: DC
Start: 2015-06-25 — End: 2016-02-29

## 2015-06-25 NOTE — Assessment & Plan Note (Addendum)
BP elevated today. Reports previously on lisinopril, but discontinued over a year ago and due to cough and told that she should control her blood pressure through diet and exercise.  Not been on medications in the past year.  - Start lisinopril 10 mg daily; cough, potentially related to sinus problems - if cough returns or worsens, will consider switching to ARB - f/u in 2 weeks

## 2015-06-25 NOTE — Assessment & Plan Note (Signed)
Start lipitor 40mg  qhs

## 2015-06-25 NOTE — Patient Instructions (Signed)
It was great seeing you today.    Take Lipitor 40mg  (1 pill) every night  Take Lisinopril 10mg  (1 pill) every daily   Please bring all your medications to every doctors visit  Sign up for My Chart to have easy access to your labs results, and communication with your Primary care physician.  Next Appointment  Please make an appointment with Dr Berkley Harvey in 2 weeks   I look forward to talking with you again at our next visit. If you have any questions or concerns before then, please call the clinic at [phone].  Take Care,   Dr Phill Myron  Diabetes mellitus tipo2 (Type 2 Diabetes Mellitus) La diabetes mellitus tipo2, generalmente denominada diabetes tipo2, es una enfermedad prolongada (crnica). En la diabetes tipo2, el pncreas no produce suficiente insulina (una hormona), las clulas son menos sensibles a la insulina que se produce (resistencia a la insulina), o ambos. Normalmente, la [employer] azcares de los alimentos a las clulas de los tejidos. Las clulas de los tejidos [employer] azcares para Dealer. La falta de insulina o la falta de una respuesta normal a la insulina hace que el exceso de azcar se acumule en la sangre en lugar de Location manager en las clulas de los tejidos. Como resultado, se producen niveles altos de Dispensing optician (hiperglucemia). El efecto de los niveles altos de azcar (glucosa) puede causar muchas complicaciones.  La diabetes tipo2 antes tambin se denominaba diabetes del [place], pero puede ocurrir a Hotel manager.  [place] persona tiene mayor predisposicin a desarrollar diabetes tipo 2 si alguien en su familia tiene la enfermedad y tambin tiene uno o ms de los siguientes factores de riesgo principales:  Sobrepeso.  Estilo de vida sedentario.  Una historia de consumo constante de alimentos de altas caloras. Mantener un peso saludable y realizar actividad fsica regular puede reducir la probabilidad  de desarrollar diabetes tipo 2. SNTOMAS  Una persona con diabetes tipo 2 no presenta sntomas en un principio. Los sntomas de la diabetes tipo 2 aparecen lentamente. Los sntomas son:  Aumento de la sed (polidipsia).  Aumento de la miccin (poliuria).  Orina con ms frecuencia durante la noche (nocturia).  Prdida de peso. La prdida de peso puede ser muy rpida.  Infecciones frecuentes y recurrentes.  Cansancio (fatiga).  Debilidad.  Cambios en la visin, como visin borrosa.  Olor a Medical illustrator.  Dolor abdominal.  Nuseas o vmitos.  Cortes o moretones que tardan en sanarse.  Hormigueo o adormecimiento de las manos y los pies. DIAGNSTICO Con frecuencia la diabetes tipo 2 no se diagnostica hasta que se presentan las complicaciones de la diabetes. La diabetes tipo 2 se diagnostica cuando los sntomas o las complicaciones se presentan y cuando aumentan los niveles de glucosa en la [place]. El nivel de glucosa en la sangre puede controlarse en uno o ms de los siguientes anlisis de sangre:  Medicin de glucosa en la sangre en [place]. No se le permitir comer durante al menos 8 horas antes de que se tome [place] de [place].  Pruebas al azar de glucosa en la sangre. El nivel de glucosa en la sangre se controla en cualquier momento del da sin importar el momento en que haya comido.  Prueba de A1c (hemoglobina glucosilada) Una prueba de A1c proporciona informacin sobre el control de la glucosa en la sangre durante los ltimos 3 meses.  Prueba de tolerancia a la glucosa oral (PTGO). La  glucosa en la sangre se mide despus de no haber comido (ayunas) durante dos horas y despus de beber una bebida que contenga glucosa. TRATAMIENTO   Usted puede necesitar administrarse insulina o medicamentos para la diabetes todos los das para [employer] niveles de glucosa en la sangre en el rango deseado.  Si [place] insulina, tal vez necesite ajustar la dosis segn los  carbohidratos que haya consumido en cada comida o colacin. El objetivo del tratamiento es mantener el nivel de azcar en la sangre previo a comer (glucosa preprandial) entre 17 y 130mg /dl. INSTRUCCIONES PARA EL CUIDADO EN EL HOGAR   Controle su nivel de hemoglobina A1c dos veces al ao.  Contrlese a diario Retail buyer de glucosa en la sangre segn las indicaciones de su mdico.  Supervise las cetonas en la orina cuando est enferma y segn las indicaciones de su [place] medicamento para la diabetes o adminstrese insulina segn las indicaciones de su mdico para Contractor nivel de glucosa en la sangre en el rango deseado.  Nunca se quede sin medicamento para la diabetes o sin insulina. Es necesario que la reciba [employer].  Si [place] insulina, tal vez deba ajustar la cantidad de insulina administrada segn los carbohidratos consumidos. Los hidratos de carbono pueden aumentar los niveles de glucosa en la sangre, pero deben incluirse en su dieta. Los hidratos de carbono aportan vitaminas, minerales y [place] que son Ardelia Mems parte esencial de una dieta saludable. Los hidratos de carbono se encuentran en frutas, verduras, cereales integrales, productos lcteos, legumbres y alimentos que contienen azcares aadidos.  Consuma alimentos saludables. Programe una cita con un nutricionista certificado para que lo ayude a Building services engineer de alimentacin adecuado para usted.  Baje de peso si es necesario.  Lleve una tarjeta de alerta mdica o use una pulsera o medalla de alerta mdica.  Lleve con usted una colacin de 15gramos de hidratos de carbono en todo momento para controlar los niveles bajos de glucosa en la sangre (hipoglucemia). Algunos ejemplos de colaciones de 15gramos de hidratos de carbono son los siguientes:  Tabletas de glucosa, 3 o 4.  Gel de glucosa, tubo de 15 gramos.  Pasas de uva, 2 cucharadas (24 gramos).  Caramelos de goma, 6.  Galletas de [place], 8.  Gaseosa  comn, 4onzas (155mililitros).  Pastillas de goma, 9.  Reconocer la hipoglucemia. La hipoglucemia se produce cuando los niveles de glucosa en la sangre son de 70 mg/dl o menos. El riesgo de hipoglucemia aumenta durante el ayuno o cuando se saltea las comidas, durante o despus de Optometrist ejercicio intenso y [place] duerme. Los sntomas de hipoglucemia son:  Temblores o sacudidas.  Disminucin de la capacidad de concentracin.  Sudoracin.  Aumento de la frecuencia cardaca.  Dolor de [place].  Sequedad en la boca.  Hambre.  Irritabilidad.  Ansiedad.  Sueo agitado.  Alteracin del habla o de la coordinacin.  Confusin.  Tratar la hipoglucemia rpidamente. Si usted est alerta y puede tragar con seguridad, siga la regla de 15/15 que consiste en:  [employer] 15 y 20gramos de glucosa de accin rpida o carbohidratos. Las opciones de accin rpida son un gel de glucosa, tabletas de glucosa, o 4 onzas (120 ml) de jugo de frutas, gaseosa comn, o leche baja en grasa.  Compruebe su nivel de glucosa en la sangre 15 minutos despus de tomar la glucosa.  Tome entre 15 y 35 gramos ms de glucosa si el nivel de glucosa en la sangre todava  es de 70mg /dl o inferior.  Ingiera una comida o una colacin en el lapso de 1 hora una vez que los niveles de glucosa en la sangre vuelven a la normalidad.  Est atento a si siente mucha sed u orina con mayor frecuencia, porque son signos tempranos de hiperglucemia. El reconocimiento temprano de la hiperglucemia permite un tratamiento oportuno. Trate la hiperglucemia segn le indic su mdico.  Haga, al menos, 111minutos de actividad fsica moderada a la semana, distribuidos en, por lo menos, 3das a la semana o como lo indique su mdico. Adems, debe realizar ejercicios de resistencia por lo menos 2veces a la semana o como lo indique su mdico. Trate de no permanecer inactivo durante ms de 36minutos seguidos.  Ajuste su medicamento y la  ingesta de alimentos, segn sea necesario, si inicia un nuevo ejercicio o deporte.  Siga su plan para los das de enfermedad cuando no puede comer o beber como de [place].  No consuma ningn producto que contenga tabaco, como cigarrillos, tabaco de Higher education careers adviser o Psychologist, sport and exercise. Si necesita ayuda para dejar de fumar, hable con el mdico.  Limite el consumo de alcohol a no ms de 1 medida por da en las mujeres no embarazadas y 2 medidas en los hombres. Debe beber alcohol solo mientras come. Hable con su mdico acerca de si el alcohol es seguro para usted. Informe a su mdico si bebe alcohol varias veces a la semana.  Concurra a todas las visitas de control como se lo haya indicado el mdico. Esto es importante.  Programe un examen de la vista poco despus del diagnstico de diabetes tipo 2 y luego anualmente.  Realice diariamente el cuidado de la piel y de los pies. Examine su piel y los pies diariamente para ver si tiene cortes, moretones, enrojecimiento, problemas en las uas, sangrado, ampollas o Pension scheme manager. Su mdico debe hacerle un examen de los pies una vez por ao.  Cepllese los dientes y encas por lo menos dos veces al da y use hilo dental al menos una vez por da. Concurra regularmente a las visitas de control con el dentista.  Comparta su plan de control de diabetes en el trabajo o en la escuela.  [place]. Se recomienda que las [employer] de 65aos con diabetes se apliquen la vacuna contra la neumona. En algunos casos, pueden administrarse dos inyecciones separadas. Pregntele al mdico si tiene la vacuna contra la neumona al da.  Aprenda a Engineer, maintenance (IT).  Obtenga la mayor cantidad posible de informacin sobre la diabetes y solicite ayuda siempre que sea necesario.  Busque programas de rehabilitacin y participe en ellos para mantener o mejorar su independencia y su calidad de vida. Solicite la derivacin a fisioterapia o terapia  ocupacional si se le [employer] o la mano, o tiene problemas para asearse, vestirse, comer, o durante la Mobile fsica. SOLICITE ATENCIN MDICA SI:   No puede comer alimentos o beber por ms de 6 horas.  Tuvo nuseas o ha vomitado durante ms de 6 horas.  Su nivel de glucosa en la sangre es mayor de 240 mg/dl.  Presenta algn cambio en el estado mental.  Desarrolla una enfermedad grave adicional.  Tuvo diarrea durante ms de 6 horas.  Ha estado enfermo o ha tenido fiebre durante un par de das y no mejora.  Siente dolor al practicar cualquier actividad fsica. SOLICITE ATENCIN MDICA DE INMEDIATO SI:  Tiene dificultad para respirar.  Tiene niveles de cetonas moderados a  altos. ASEGRESE DE QUE:  Comprende estas instrucciones.  Controlar su afeccin.  Recibir ayuda de inmediato si no mejora o si empeora. Document Released: 11/20/2005 Document Revised: 04/06/2014 [facility] Patient Information 2015 [place], [place]. This information is not intended to replace advice given to you by your health care provider. Make sure you discuss any questions you have with your health care provider.

## 2015-06-25 NOTE — Assessment & Plan Note (Signed)
Discussed following local provider diet and increasing exercise to promote weight loss - She will follow up with diabetes education classes through [facility] - defer starting diabetes medication at this time - start ASA 81mg  qd

## 2015-06-25 NOTE — Assessment & Plan Note (Signed)
Likely due to allergies versus possible silent GERD.  Likely cause, and ear fullness, Throat irritation in possibly cough - Start Flonase - Reassess in 2 weeks - Advised discontinuing OTC sinus medications as likely containing pseudoephedrine and contributing to hypertension

## 2015-06-25 NOTE — Progress Notes (Signed)
  Patient name: Sheila [name] MRN [id number]  Date of birth: [birth year]  CC & HPI:  Sheila [name] is a 54 y.o. female presenting today for preDM, HTN and HLD.   DIABETES  Lab Results  Component Value Date   HGBA1C 6.0* 06/10/2015    CHRONIC HYPERTENSION BP Readings from Last 3 Encounters:  06/25/15 152/96  06/10/15 150/98  05/20/15 120/84    Disease Monitoring  Chest pain: no   Dyspnea: no   Claudication: no  Medication Side Effects: not taking medication   HLD Lab Results  Component Value Date   LDLCALC 160* 06/10/2015   Sinus congestion - Reports sinus congestion, ear fullness, and throat irritation several months - Been using OTC sinus medications and recent started Flonase  ROS: See HPI  Medications & Allergies: Reviewed  Social History: Reviewed:   Objective Findings:  Vitals: BP 152/96 mmHg  Pulse 85  Temp(Src) 98.3 F (36.8 C) (Oral)  Ht 4\' 11"  (1.499 m)  Wt 180 lb 8 oz (81.874 kg)  BMI 36.44 kg/m2  Gen: NAD HEENT: Nasal turbinates swollen bilaterally; TMs clear bilaterally  CV: RRR w/o m/r/g, pulses +2 b/l Resp: CTAB w/ normal respiratory effort  Assessment & Plan:   Please See Problem Focused Assessment & Plan

## 2015-07-02 ENCOUNTER — Ambulatory Visit: Payer: No Typology Code available for payment source

## 2015-07-06 ENCOUNTER — Ambulatory Visit: Payer: No Typology Code available for payment source

## 2015-07-09 ENCOUNTER — Ambulatory Visit: Payer: No Typology Code available for payment source

## 2015-09-09 ENCOUNTER — Telehealth: Payer: Self-pay

## 2015-09-09 NOTE — Telephone Encounter (Signed)
Called and cleared up information and does not want any health coaching or BP monitoring at the present time. Will call us if needed and I will call back in 30 to 60 days.

## 2015-11-25 ENCOUNTER — Other Ambulatory Visit: Payer: Self-pay | Admitting: Family Medicine

## 2015-11-25 NOTE — Telephone Encounter (Signed)
Please call. Needs appointment to discuss HTN. I have refilled lisinopril medication for 1 mont but needs to be seen prior to additional refills.

## 2015-11-30 NOTE — Telephone Encounter (Signed)
Letter mailed to patient to contact our office to make an appt to discuss her blood pressure. Claron Rosencrans,CMA

## 2016-01-31 ENCOUNTER — Encounter: Payer: Self-pay | Admitting: Family Medicine

## 2016-01-31 ENCOUNTER — Ambulatory Visit (INDEPENDENT_AMBULATORY_CARE_PROVIDER_SITE_OTHER): Payer: Self-pay | Admitting: Family Medicine

## 2016-01-31 VITALS — BP 155/93 | HR 103 | Temp 99.0°F | Wt 182.0 lb

## 2016-01-31 DIAGNOSIS — R1010 Upper abdominal pain, unspecified: Secondary | ICD-10-CM | POA: Insufficient documentation

## 2016-01-31 DIAGNOSIS — R1032 Left lower quadrant pain: Secondary | ICD-10-CM

## 2016-01-31 DIAGNOSIS — R109 Unspecified abdominal pain: Secondary | ICD-10-CM | POA: Insufficient documentation

## 2016-01-31 LAB — COMPLETE METABOLIC PANEL WITH GFR
ALT: 14 U/L (ref 6–29)
AST: 12 U/L (ref 10–35)
Albumin: 4.2 g/dL (ref 3.6–5.1)
Alkaline Phosphatase: 105 U/L (ref 33–130)
BUN: 15 mg/dL (ref 7–25)
CHLORIDE: 103 mmol/L (ref 98–110)
CO2: 27 mmol/L (ref 20–31)
Calcium: 9.8 mg/dL (ref 8.6–10.4)
Creat: 0.59 mg/dL (ref 0.50–1.05)
GFR, Est African American: >89 mL/min (ref >=60)
GLUCOSE: 89 mg/dL (ref 65–99)
POTASSIUM: 4.1 mmol/L (ref 3.5–5.3)
SODIUM: 138 mmol/L (ref 135–146)
Total Bilirubin: 0.2 mg/dL (ref 0.2–1.2)
Total Protein: 7.6 g/dL (ref 6.1–8.1)

## 2016-01-31 LAB — CBC WITH DIFFERENTIAL/PLATELET
BASOS ABS: 0.1 10*3/uL (ref 0.0–0.1)
BASOS PCT: 1 % (ref 0–1)
Eosinophils Absolute: 0.4 10*3/uL (ref 0.0–0.7)
Eosinophils Relative: 4 % (ref 0–5)
HCT: 36.4 % (ref 36.0–46.0)
Hemoglobin: 12.1 g/dL (ref 12.0–15.0)
Lymphocytes Relative: 34 % (ref 12–46)
Lymphs Abs: 3.6 10*3/uL (ref 0.7–4.0)
MCH: 28.3 pg (ref 26.0–34.0)
MCHC: 33.2 g/dL (ref 30.0–36.0)
MCV: 85.2 fL (ref 78.0–100.0)
MPV: 10.3 fL (ref 8.6–12.4)
Monocytes Absolute: 0.5 10*3/uL (ref 0.1–1.0)
Monocytes Relative: 5 % (ref 3–12)
NEUTROS ABS: 5.9 10*3/uL (ref 1.7–7.7)
NEUTROS PCT: 56 % (ref 43–77)
Platelets: 477 10*3/uL — ABNORMAL HIGH (ref 150–400)
RBC: 4.27 MIL/uL (ref 3.87–5.11)
RDW: 15.4 % (ref 11.5–15.5)
WBC: 10.6 10*3/uL — ABNORMAL HIGH (ref 4.0–10.5)

## 2016-01-31 LAB — POCT URINALYSIS DIPSTICK
BILIRUBIN UA: NEGATIVE
Glucose, UA: NEGATIVE
KETONES UA: NEGATIVE
Nitrite, UA: NEGATIVE
PROTEIN UA: 100
Spec Grav, UA: 1.015
Urobilinogen, UA: 0.2
pH, UA: 6.5

## 2016-01-31 LAB — POCT UA - MICROSCOPIC ONLY

## 2016-01-31 NOTE — Patient Instructions (Signed)
It was great seeing you today.  Several things could be responsible for your abdominal pain: Possible hernia versus diverticulitis versus uterine fibroids.  We'll do some blood work and CT of her abdomen to further evaluate this.  I will call you with the results.  Continue ibuprofen 400-600 mg every 6 hours as needed for pain.    Please bring all your medications to every doctors visit  Sign up for My Chart to have easy access to your labs results, and communication with your Primary care physician.  Next Appointment  Please make an appointment with Dr Berkley Harvey in 1 week for abdominal pain   I look forward to talking with you again at our next visit. If you have any questions or concerns before then, please call the clinic at [phone].  Take Care,   Dr Phill Myron

## 2016-01-31 NOTE — Assessment & Plan Note (Signed)
Left-sided abdominal pain associated with menstrual bleeding that had not been present for several months.  Significant history of uterine fibroid, bilateral tubal ligation and incarcerated umbilical hernia status post repair.  Given the history of this previous incarcerated hernia;  Hernia is the most pressing concern.  No masses or hernias appreciated on abdominal exam.  No guarding or rebounding.  - Check CBC, CMP,  - CT abdomen with contrast - Advise follow-up in one week; or I will call if she needs to be seen sooner based on workup - If imaging negative; Consider pelvic ultrasound for reevaluation of previous fibroids - Continue ibuprofen 400-600 mg every 6 hours when necessary

## 2016-01-31 NOTE — Progress Notes (Signed)
   Subjective:    Patient ID: Sheila [name], female    DOB: Apr 04, 1961, 55 y.o.   MRN: [id number]  Seen for Same day visit for   CC: Abdominal pain  She reports left-sided sharp, constant abdominal pain for the past week.  She reports pain radiates from left lower quadrant, left upper quadrant.  Does not radiate to back.  Pain is constant but waxes and wanes in severity.  Worse with lying on the affected side, Sitting down and with tight clothing.  Improves somewhat with ibuprofen 400-600 mg once or twice a day.  She reports associated vaginal bleeding for the past week.  Reports she has not had a menstrual cycle since October 2016 and it was only occurring every 1-3 months at that time.  She has a history of incarcerated umbilical hernia status post repair.  She denies any blood in her stool. Does report one episode of dark stool at onset of abdominal pain has resolved.  Reports history of " Colon Problems "but is unable to specify what those are; she says she tries to eat healthy to limit these.  Also history of bilateral tubal ligation.  She reports she has felt some lightheadedness over the past week, denies chest pain, shortness of breath or palpitations.  Denies fever or vomiting   Review of Systems  Constitutional: Negative for fever.  Gastrointestinal: Positive for nausea and abdominal pain. Negative for vomiting, diarrhea, constipation, blood in stool and melena.  Musculoskeletal: Negative for back pain.   Objective:  BP 155/93 mmHg  Pulse 103  Temp(Src) 99 F (37.2 C) (Oral)  Wt 182 lb (82.555 kg)  LMP 01/23/2016  General: NAD Cardiac: RRR, normal heart sounds, no murmurs.  bilaterally Respiratory: CTAB, normal effort Abdomen: soft, mild tenderness from left upper quadrant to left lower quadrant; no masses appreciated, no guarding or rebounding; Bowel sounds present Extremities: no edema or cyanosis. WWP. Skin: warm and dry, no rashes noted    Assessment & Plan:    Abdominal pain Left-sided abdominal pain associated with menstrual bleeding that had not been present for several months.  Significant history of uterine fibroid, bilateral tubal ligation and incarcerated umbilical hernia status post repair.  Given the history of this previous incarcerated hernia;  Hernia is the most pressing concern.  No masses or hernias appreciated on abdominal exam.  No guarding or rebounding.  - Check CBC, CMP,  - CT abdomen with contrast - Advise follow-up in one week; or I will call if she needs to be seen sooner based on workup - If imaging negative; Consider pelvic ultrasound for reevaluation of previous fibroids - Continue ibuprofen 400-600 mg every 6 hours when necessary

## 2016-02-01 LAB — TSH: TSH: 2.38 m[IU]/L

## 2016-02-15 ENCOUNTER — Ambulatory Visit: Payer: Self-pay

## 2016-02-18 ENCOUNTER — Encounter (HOSPITAL_COMMUNITY): Payer: Self-pay

## 2016-02-18 ENCOUNTER — Telehealth: Payer: Self-pay | Admitting: *Deleted

## 2016-02-18 ENCOUNTER — Ambulatory Visit (HOSPITAL_COMMUNITY)
Admission: RE | Admit: 2016-02-18 | Discharge: 2016-02-18 | Disposition: A | Discharge status: Home / Self Care | Payer: Self-pay | Source: Ambulatory Visit | Attending: Family Medicine | Admitting: Family Medicine

## 2016-02-18 DIAGNOSIS — K5790 Diverticulosis of intestine, part unspecified, without perforation or abscess without bleeding: Secondary | ICD-10-CM | POA: Insufficient documentation

## 2016-02-18 DIAGNOSIS — K5732 Diverticulitis of large intestine without perforation or abscess without bleeding: Secondary | ICD-10-CM

## 2016-02-18 DIAGNOSIS — R1032 Left lower quadrant pain: Secondary | ICD-10-CM | POA: Insufficient documentation

## 2016-02-18 MED ORDER — CIPROFLOXACIN HCL 500 MG PO TABS: 500.0000 mg | ORAL_TABLET | Freq: Two times a day (BID) | ORAL | Status: DC

## 2016-02-18 MED ORDER — METRONIDAZOLE 500 MG PO TABS: 500.0000 mg | ORAL_TABLET | Freq: Three times a day (TID) | ORAL | Status: DC

## 2016-02-18 MED ORDER — IOHEXOL 300 MG/ML  SOLN
100.0000 mL | Freq: Once | INTRAMUSCULAR | Status: AC | PRN
  Administered 2016-02-18: 100 mL via INTRAVENOUS

## 2016-02-18 NOTE — Telephone Encounter (Signed)
Call report from [facility] Radiology: Mild focal acute sigmoid diverticulitis in the left lower quadrant without perforation, abscess, or obstruction.  Other chronic and postoperative findings see full report.  Paged Dr. Berkley Harvey to make sure he know results are in EPIC.  Will also send message via AMION.  Derl Barrow, RN

## 2016-02-18 NOTE — Telephone Encounter (Signed)
Called and discussed CT result with patient. She continues to have some LLQ pain but no fevers, vomiting or blood in bowel movements.  - Called in Flagyl 500mg  TID x 10 days; Cipro 500mg  BID x 10 days - Made apt with Dr Berkley Harvey in 4 days for reassessment -Advised going to ED if develops uncontrollable pain, vomiting and unable to keep down liquids or new / concerning symptoms.

## 2016-02-22 ENCOUNTER — Encounter: Payer: Self-pay | Admitting: Family Medicine

## 2016-02-22 ENCOUNTER — Ambulatory Visit (INDEPENDENT_AMBULATORY_CARE_PROVIDER_SITE_OTHER): Payer: Self-pay | Admitting: Family Medicine

## 2016-02-22 VITALS — BP 153/85 | HR 90 | Temp 98.0°F | Ht 59.0 in | Wt 181.0 lb

## 2016-02-22 DIAGNOSIS — K5732 Diverticulitis of large intestine without perforation or abscess without bleeding: Secondary | ICD-10-CM

## 2016-02-22 NOTE — Assessment & Plan Note (Addendum)
Minimal improvement in pain with Cipro and Flagyl 3 days, however, remains able to tolerate by mouth intake without fevers or bloody bowel movements.  - Continue antibiotics to complete 10 day course - Discussed return precautions if she develops uncontrollable vomiting, inability to tolerate by mouth intake, fevers, worsening pain or blood in bowel movements - Ibuprofen 400 mg every 6 hours when necessary - Follow-up once antibiotics completed or sooner if new or concerning symptoms - CT scan also showed fibroids; consider referral to gynecology if pain improves but does not resolve with treatment of diverticulitis

## 2016-02-22 NOTE — Progress Notes (Signed)
  Patient name: Sheila [name] MRN [id number]  Date of birth: January 18, 1961  CC & HPI:  Sheila [name] is a 55 y.o. female presenting today for Follow-up for diverticulitis.  She reports minimal improvement in her pain since starting antibiotics.  Continues to have a left lower quadrant pain, worse with long periods of sitting and movement.  Denies any fevers, chills, nausea, vomiting, diarrhea, blood in stool.  She reports some history of constipation.  Has not taken anything for pain.   Smoking History Noted  Objective Findings:  Vitals: BP 153/85 mmHg  Pulse 90  Temp(Src) 98 F (36.7 C) (Oral)  Ht 4\' 11"  (1.499 m)  Wt 181 lb (82.101 kg)  BMI 36.54 kg/m2  LMP 01/23/2016  Gen: NAD CV: RRR w/o m/r/g, pulses +2 b/l Resp: CTAB w/ normal respiratory effort GI: No skin changes; BS + x 4 quads; Mild tenderness in LLQ  Assessment & Plan:   Diverticulitis of colon Minimal improvement in pain with Cipro and Flagyl 3 days, however, remains able to tolerate by mouth intake without fevers or bloody bowel movements.  - Continue antibiotics to complete 10 day course - Discussed return precautions if she develops uncontrollable vomiting, inability to tolerate by mouth intake, fevers, worsening pain or blood in bowel movements - Ibuprofen 400 mg every 6 hours when necessary - Follow-up once antibiotics completed or sooner if new or concerning symptoms - CT scan also showed fibroids; consider referral to gynecology if pain improves but does not resolve with treatment of diverticulitis

## 2016-02-22 NOTE — Patient Instructions (Signed)
Contine tomando antibiticos segn lo prescrito. - Tome ibuprofen 400mg  (2 pldoras) cada 6-8 horas segn sea necesario para el dolor. - Devolver una vez que haya completado los antibiticos o antes si se desarrollan fiebres, sangre en las heces o vmitos e incapaz de mantener lquidos.

## 2016-02-29 ENCOUNTER — Ambulatory Visit (INDEPENDENT_AMBULATORY_CARE_PROVIDER_SITE_OTHER): Payer: Self-pay | Admitting: Family Medicine

## 2016-02-29 ENCOUNTER — Encounter: Payer: Self-pay | Admitting: Family Medicine

## 2016-02-29 VITALS — BP 148/102 | HR 103 | Temp 98.6°F | Ht 59.0 in | Wt 176.0 lb

## 2016-02-29 DIAGNOSIS — E785 Hyperlipidemia, unspecified: Secondary | ICD-10-CM

## 2016-02-29 DIAGNOSIS — R7303 Prediabetes: Secondary | ICD-10-CM

## 2016-02-29 DIAGNOSIS — K5732 Diverticulitis of large intestine without perforation or abscess without bleeding: Secondary | ICD-10-CM

## 2016-02-29 DIAGNOSIS — I10 Essential (primary) hypertension: Secondary | ICD-10-CM

## 2016-02-29 MED ORDER — LISINOPRIL 10 MG PO TABS: 10.0000 mg | ORAL_TABLET | Freq: Every day | ORAL | Status: DC

## 2016-02-29 MED ORDER — ATORVASTATIN CALCIUM 40 MG PO TABS: 40.0000 mg | ORAL_TABLET | Freq: Every day | ORAL | Status: DC

## 2016-02-29 MED ORDER — ASPIRIN EC 81 MG PO TBEC: 81.0000 mg | DELAYED_RELEASE_TABLET | Freq: Every day | ORAL | Status: DC

## 2016-02-29 NOTE — Patient Instructions (Signed)
  Fue muy bueno verte hoy. Me alegro de que tu dolor de estmago est mejor. Completar los antibiticos y el seguimiento en [place].   Reinicie su lisinopril, aspirina y Lipitor. Recomiendo tomar todos sus medicamentos por la noche  Por favor, traiga todos sus medicamentos a cada visita a los mdicos Regstrese en My Chart para tener un acceso fcil a los resultados de su laboratorio y la comunicacin con su mdico de Marketing executive.  Prxima cita Por favor, marque una cita con el Dr. Berkley Harvey en 1 semana  Espero poder hablar con usted de nuevo en nuestra prxima visita. Si tiene alguna pregunta o preocupacin antes, llame a la clnica al [phone].  [place],  Dr. Phill Myron

## 2016-03-02 NOTE — Assessment & Plan Note (Signed)
Complete current course of antibiotics - Return to clinic in one week if pain persists; may need to prolong antibiotics versus consider fibroids as additional cause of the pain

## 2016-03-02 NOTE — Progress Notes (Signed)
   Subjective:    Patient ID: Sheila [name], female    DOB: [birth year], 55 y.o.   MRN: [id number]  Seen for Same day visit for   CC: Abdominal pain  She reports improvement in her left lower quadrant abdominal pain - She is on last day of antibiotics for diverticulitis - Denies any nausea, vomiting, diarrhea or blood in stool - Denies any fevers  Review of Systems   See HPI for ROS. Objective:  BP 148/102 mmHg  Pulse 103  Temp(Src) 98.6 F (37 C) (Oral)  Ht 4\' 11"  (1.499 m)  Wt 176 lb (79.833 kg)  BMI 35.53 kg/m2  LMP 01/23/2016  General: NAD Cardiac: RRR, normal heart sounds, no murmurs. 2+ radial and PT pulses bilaterally Respiratory: CTAB, normal effort Abdomen: soft, mild left lower quadrant tenderness to deep palpation; Bowel sounds present    Assessment & Plan:   Diverticulitis of colon Complete current course of antibiotics - Return to clinic in one week if pain persists; may need to prolong antibiotics versus consider fibroids as additional cause of the pain

## 2016-03-06 ENCOUNTER — Ambulatory Visit (INDEPENDENT_AMBULATORY_CARE_PROVIDER_SITE_OTHER): Payer: Self-pay | Admitting: Family Medicine

## 2016-03-06 VITALS — BP 160/86 | HR 72 | Temp 97.9°F | Ht 59.0 in | Wt 177.6 lb

## 2016-03-06 DIAGNOSIS — E785 Hyperlipidemia, unspecified: Secondary | ICD-10-CM

## 2016-03-06 DIAGNOSIS — R7303 Prediabetes: Secondary | ICD-10-CM

## 2016-03-06 DIAGNOSIS — R319 Hematuria, unspecified: Secondary | ICD-10-CM

## 2016-03-06 DIAGNOSIS — I10 Essential (primary) hypertension: Secondary | ICD-10-CM

## 2016-03-06 DIAGNOSIS — K5730 Diverticulosis of large intestine without perforation or abscess without bleeding: Secondary | ICD-10-CM

## 2016-03-06 LAB — POCT URINALYSIS DIPSTICK
Bilirubin, UA: NEGATIVE
GLUCOSE UA: NEGATIVE
Ketones, UA: NEGATIVE
Leukocytes, UA: NEGATIVE
NITRITE UA: NEGATIVE
Protein, UA: NEGATIVE
Spec Grav, UA: 1.015
UROBILINOGEN UA: 0.2
pH, UA: 7

## 2016-03-06 LAB — POCT UA - MICROSCOPIC ONLY

## 2016-03-06 MED ORDER — LOSARTAN POTASSIUM-HCTZ 50-12.5 MG PO TABS: 1.0000 | ORAL_TABLET | Freq: Every day | ORAL | Status: DC

## 2016-03-06 NOTE — Assessment & Plan Note (Signed)
Tolerating statin well - Continue Lipitor 40 mg daily at bedtime

## 2016-03-06 NOTE — Assessment & Plan Note (Signed)
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

## 2016-03-06 NOTE — Assessment & Plan Note (Signed)
Check urine microalbumin. ?

## 2016-03-06 NOTE — Assessment & Plan Note (Signed)
Complete resolution of pain and episode of diverticulitis.  - Educated on diverticulosis and need to follow healthy diet - Encouraged daily fiber supplement - Referred for screening colonoscopy

## 2016-03-06 NOTE — Addendum Note (Signed)
Addended by: [place] Pink on: 03/06/2016 04:02 PM   Modules accepted: Orders, SmartSet

## 2016-03-06 NOTE — Assessment & Plan Note (Addendum)
Blood pressure remains elevated.  Reports cough with lisinopril as well as cough with previous blood pressure medication that she cannot recall (likely ACE-I) - Discontinue lisinopril - Start Hyzaar 50/12.5 daily - Follow-up in one week for blood pressure recheck - Continue ASA 81 mg for primary prevention of stroke - Need to recheck creatinine at next visit

## 2016-03-06 NOTE — Patient Instructions (Addendum)
Fue muy bueno verte hoy. Me alegro de que su dolor abdominal se ha resuelto. Te recomiendo que comas una dieta alta en fibra, y tomar un suplemento de fibra diaria (Metamucil, [place], [place])  Su presin arterial sigue alta. Stop lisinopril Comience Hyzaar (50mg  / 12.5mg ) 1 pldora cada maana Llame y programe su colonoscopia  Por favor, traiga todos sus medicamentos a cada visita a los mdicos Regstrese en My Chart para tener un acceso fcil a los resultados de su laboratorio y la comunicacin con su mdico de Marketing executive.  Prxima cita Por favor, haga una cita con el Dr. Berkley Harvey en 1 semana para la presin arterial  Espero poder hablar con usted de nuevo en nuestra prxima visita. Si tiene alguna pregunta o preocupacin antes, llame a la clnica al [phone].  Cudate,  Dr. Phill Myron  Diverticulosis (Diverticulosis) La diverticulosis es una enfermedad que aparece cuando se forman pequeos bolsillos (divertculos) en las paredes del colon. El colon, o intestino grueso, es el lugar donde se absorbe agua y se forman las heces. Los bolsillos se forman cuando la capa interna del colon ejerce presin sobre los puntos dbiles de las capas externas. CAUSAS  Nadie sabe con exactitud qu causa la diverticulosis. FACTORES DE RIESGO  Ser mayor de 53aos. El riesgo de desarrollar esta enfermedad aumenta con la edad. La diverticulosis es poco frecuente en las personas menores de [place]. A los 80aos, casi todas las personas tienen la enfermedad.  Comer una dieta con bajo contenido de [place].  Estar estreido con frecuencia.  Tener sobrepeso.  No hacer suficiente ejercicio fsico.  Fumar.  Tomar analgsicos de venta libre, como aspirina e ibuprofeno. SNTOMAS  La mayora de las personas que tienen diverticulosis no presentan sntomas. DIAGNSTICO  Dado que la diverticulosis no suele causar sntomas, los mdicos a menudo descubren la enfermedad durante un examen de otros  problemas de colon. En muchos casos, el mdico diagnosticar la diverticulosis mientras utiliza un endoscopio flexible para examinar el colon (colonoscopa). TRATAMIENTO  Si nunca tuvo una infeccin relacionada con la diverticulosis, es posible que no necesite tratamiento. Si ha tenido una infeccin antes, el tratamiento puede incluir:  Comer ms frutas, verduras y cereales.  Tomar un suplemento de [place].  Tomar un suplemento de bacterias vivas (probitico).  Tomar medicamentos para relajar el colon. INSTRUCCIONES PARA EL CUIDADO EN EL HOGAR   Beba por lo menos entre 6 y 8vasos de agua por da para Engineer, civil (consulting).  Trate de no hacer fuerza al mover el intestino.  Cumpla con todas las visitas de control. Si ha tenido una infeccin antes:   Aumente la cantidad de fibra en la dieta, segn las indicaciones del mdico o del nutricionista.  Tome un suplemento dietario con fibras si el mdico lo autoriza.  Tome los medicamentos solamente como se lo haya indicado el mdico. SOLICITE ATENCIN MDICA SI:   Siente dolor abdominal.  Tiene meteorismo.  Tiene clicos.  No ha defecado en 3das. SOLICITE ATENCIN MDICA DE INMEDIATO SI:   El dolor empeora.  El meteorismo [employer].  Tiene fiebre o escalofros, y los sntomas empeoran repentinamente.  Comienza a vomitar.  La materia fecal es sanguinolenta o negra. ASEGRESE DE QUE:  Comprende estas instrucciones.  Controlar su afeccin.  Recibir ayuda de inmediato si no mejora o si empeora.   Esta informacin no tiene Marine scientist el consejo del mdico. Asegrese de hacerle al mdico cualquier pregunta que tenga.   Document Released: 11/02/2008 Document Revised: 11/25/2013 Elsevier  Interactive Patient Education [employer].

## 2016-03-06 NOTE — Progress Notes (Signed)
  Patient name: Sheila [name] MRN [id number]  Date of birth: 29-Apr-1961  CC & HPI:  Sheila [name] is a 55 y.o. female presenting today for Follow-up for diverticulitis and hypertension.   CHRONIC HYPERTENSION  BP Readings from Last 3 Encounters:  03/06/16 160/86  02/29/16 148/102  02/22/16 153/85    Control: poor Disease Monitoring  Blood pressure range outside clinc: not checking  Chest pain: no   Dyspnea: no   Claudication: no  Medication compliance: yes  Medication Side Effects: Cough; Also reports cough with previous blood pressure medication that she cannot recall Preventitive Healthcare:   History  Smoking status  . Never Smoker   Smokeless tobacco  . Never Used   Hyperlipidemia  Medication Compliance: yes  Side Effects?: no muscle pain or weakness, RUQ pain; jaundice  Diverticulitis - She reports complete resolution of her left lower quadrant abdominal pain - Denies fever, blood in her stools  Hematuria - Denies blood in her urine - Reports she was on her menstrual cycle when we collected urine last time  Smoking History Noted  Objective Findings:  Vitals: BP 160/86 mmHg  Pulse 72  Temp(Src) 97.9 F (36.6 C) (Oral)  Ht 4\' 11"  (1.499 m)  Wt 177 lb 9.6 oz (80.559 kg)  BMI 35.85 kg/m2  Gen: NAD CV: RRR w/o m/r/g, pulses +2 b/l Resp: CTAB w/ normal respiratory effort GI: BS + x 4 quads; No tenderness or masses, No CVA tenderness  Assessment & Plan:   Hypertension Blood pressure remains elevated.  Reports cough with lisinopril as well as cough with previous blood pressure medication that she cannot recall (likely ACE-I) - Discontinue lisinopril - Start Hyzaar 50/12.5 daily - Follow-up in one week for blood pressure recheck - Continue ASA 81 mg for primary prevention of stroke - Need to recheck creatinine at next visit  Diverticulosis Complete resolution of pain and episode of diverticulitis.  - Educated on diverticulosis and  need to follow healthy diet - Encouraged daily fiber supplement - Referred for screening colonoscopy  HLD (hyperlipidemia) Tolerating statin well - Continue Lipitor 40 mg daily at bedtime  Prediabetes Check urine microalbumin  Hematuria Likely due to developmental menstrual cycle - Recheck UA

## 2016-03-07 LAB — MICROALBUMIN, URINE: MICROALB UR: 0.6 mg/dL

## 2016-03-14 ENCOUNTER — Encounter: Payer: Self-pay | Admitting: Family Medicine

## 2016-03-14 ENCOUNTER — Ambulatory Visit (INDEPENDENT_AMBULATORY_CARE_PROVIDER_SITE_OTHER): Payer: Self-pay | Admitting: Family Medicine

## 2016-03-14 VITALS — BP 132/88 | HR 79 | Temp 98.1°F | Ht 59.0 in | Wt 178.0 lb

## 2016-03-14 DIAGNOSIS — E785 Hyperlipidemia, unspecified: Secondary | ICD-10-CM

## 2016-03-14 DIAGNOSIS — I10 Essential (primary) hypertension: Secondary | ICD-10-CM

## 2016-03-14 DIAGNOSIS — R319 Hematuria, unspecified: Secondary | ICD-10-CM

## 2016-03-14 DIAGNOSIS — K5730 Diverticulosis of large intestine without perforation or abscess without bleeding: Secondary | ICD-10-CM

## 2016-03-14 NOTE — Assessment & Plan Note (Signed)
We'll recheck UA next visit

## 2016-03-14 NOTE — Patient Instructions (Addendum)
It was great seeing you today.   1. You're swelling in your cheek and ear could be related to your current sinus infection.  Restart Flonase daily.  Return to clinic if you develop any pain in your eyes or blurry vision; high fevers or swelling of the lips or throat.  2. Continue taking Hyzaar one pill daily.    Please bring all your medications to every doctors visit  Sign up for My Chart to have easy access to your labs results, and communication with your Primary care physician.  Next Appointment  Please make an appointment with Dr Berkley Harvey in 1 month for blood pressure and pre-diabetes   I look forward to talking with you again at our next visit. If you have any questions or concerns before then, please call the clinic at [phone].  Take Care,   Dr Phill Myron

## 2016-03-14 NOTE — Progress Notes (Signed)
  Patient name: Josiphine Schwaiger MRN [id number]  Date of birth: 24-Oct-1961  CC & HPI:  Anjanette Rippey is a 55 y.o. female presenting today for HTN, LLQ pain.   CHRONIC HYPERTENSION  BP Readings from Last 3 Encounters:  03/14/16 132/88  03/06/16 160/86  02/29/16 148/102    Control: fair Disease Monitoring  Blood pressure range outside clinc: not checking  Chest pain: no   Dyspnea: no   Claudication: no  Medication compliance: yes  Medication Side Effects: No Dizziness/lightheadedness;  - This report focal swelling underneath right eye as well as swelling at right jaw/under ear.  Denies any swelling of her lips.  Denies any difficulty swallowing.  Does report that she has had nasal congestion and inflammation of the past several days and.  Says that she has been sleeping on her right side.  - Reports the cough has resolved with switching to Hyzaar   History  Smoking status  . Never Smoker   Smokeless tobacco  . Never Used   LLQ ab pain.  - Reports some lower abdominal pressure when sitting or wearing tight clothes .  Ports pain is different than her recent pain associated with her diverticulitis.  - Denies fevers, chills, constipation, diarrhea, blood in stool  - She does want to consider colonoscopy, but currently is without insurance or orange card  Smoking History Noted  Objective Findings:  Vitals: BP 132/88 mmHg  Pulse 79  Temp(Src) 98.1 F (36.7 C) (Oral)  Ht 4\' 11"  (1.499 m)  Wt 178 lb (80.74 kg)  BMI 35.93 kg/m2  Gen: NAD CV: RRR w/o m/r/g, pulses +2 b/l Resp: CTAB w/ normal respiratory effort GI: No skin changes; BS + x 4 quads; No tenderness or masses, No CVA tenderness  Assessment & Plan:   Diverticulosis Doing well. New LLQ pain likely related to constipation vs Fibroid  - Continue to observe - Recommend a colonoscopy; she will meet with financial counselor to discuss reapplication for [facility] card  Hematuria We'll recheck UA next  visit  HLD (hyperlipidemia) Tolerating statin well - continue Lipitor  Hypertension Blood pressure within normal limits after switching to Hyzaar.  Cough resolved - Continue Hyzaar;  - Follow-up in 2-4 weeks

## 2016-03-14 NOTE — Assessment & Plan Note (Signed)
Tolerating statin well - continue Lipitor

## 2016-03-14 NOTE — Assessment & Plan Note (Signed)
Doing well. New LLQ pain likely related to constipation vs Fibroid  - Continue to observe - Recommend a colonoscopy; she will meet with financial counselor to discuss reapplication for [facility] card

## 2016-03-14 NOTE — Assessment & Plan Note (Signed)
Blood pressure within normal limits after switching to Hyzaar.  Cough resolved - Continue Hyzaar;  - Follow-up in 2-4 weeks

## 2016-04-04 ENCOUNTER — Ambulatory Visit: Payer: No Typology Code available for payment source | Admitting: Family Medicine

## 2016-04-19 ENCOUNTER — Encounter: Payer: Self-pay | Admitting: Family Medicine

## 2016-04-19 ENCOUNTER — Ambulatory Visit (INDEPENDENT_AMBULATORY_CARE_PROVIDER_SITE_OTHER): Payer: Self-pay | Admitting: Family Medicine

## 2016-04-19 VITALS — BP 139/82 | HR 77 | Temp 98.2°F | Ht 59.0 in | Wt 179.0 lb

## 2016-04-19 DIAGNOSIS — E785 Hyperlipidemia, unspecified: Secondary | ICD-10-CM

## 2016-04-19 DIAGNOSIS — K5730 Diverticulosis of large intestine without perforation or abscess without bleeding: Secondary | ICD-10-CM

## 2016-04-19 DIAGNOSIS — R7303 Prediabetes: Secondary | ICD-10-CM

## 2016-04-19 DIAGNOSIS — I10 Essential (primary) hypertension: Secondary | ICD-10-CM

## 2016-04-19 LAB — POCT GLYCOSYLATED HEMOGLOBIN (HGB A1C): Hemoglobin A1C: 5.8

## 2016-04-19 MED ORDER — LOSARTAN POTASSIUM-HCTZ 50-12.5 MG PO TABS: 1.0000 | ORAL_TABLET | Freq: Every day | ORAL | Status: DC

## 2016-04-19 NOTE — Patient Instructions (Signed)
Fue muy bueno verte hoy. Su presin arterial y azcar en la sangre bajo un gran control; mantener el buen Duc[place]. Es maravilloso que usted est comenzando un programa de ejercicio.  Por favor, traiga todos sus medicamentos a cada visita a los mdicos Regstrese en My Chart para tener un acceso fcil a los resultados de su laboratorio y la comunicacin con su mdico de Marketing executive.  Prxima cita Por favor llame para hacer una cita con el Dr. Berkley Harvey en 3 meses  Espero poder hablar con usted de nuevo en nuestra prxima visita. Si tiene alguna pregunta o preocupacin antes, llame a la clnica al [phone].  Cudate,  Dr. Phill Myron

## 2016-04-19 NOTE — Assessment & Plan Note (Signed)
Complete resolution of left lower quadrant abdominal pain due to recent diverticulitis - Continue to monitor

## 2016-04-19 NOTE — Assessment & Plan Note (Signed)
Blood pressure currently well-controlled - Continue Hyzaar 50/12.5 - Continue ASA 81 mg - Follow-up in 3 months

## 2016-04-19 NOTE — Assessment & Plan Note (Signed)
Tolerating Lipitor well - Continue 40 mg daily at bedtime

## 2016-04-19 NOTE — Progress Notes (Signed)
  Patient name: Sheila [name] MRN [id number]  Date of birth: 24-Oct-1961  CC & HPI:  Sheila [name] is a 55 y.o. female presenting today for DM, HTN, HLD  DIABETES  Lab Results  Component Value Date   HGBA1C 5.8 04/19/2016    Blood Sugar Ranges: Not checking  Symptoms of Hypoglycemia? NO  Comorbid Symptoms:  no Neuropathy: no Vision problems  Medication Side Effects: NA  CHRONIC HYPERTENSION BP Readings from Last 3 Encounters:  04/19/16 139/82  03/14/16 132/88  03/06/16 160/86    Disease Monitoring  Blood pressure range outside clinc: not checking  Chest pain: no   Dyspnea: no   Claudication: no  Medication Side Effects: No lightheadedness, rash    HLD Lab Results  Component Value Date   LDLCALC 160* 06/10/2015    Medication Compliance: yes  Side Effects?: no muscle pain or weakness, RUQ pain; jaundice  Medication compliance: yes   Preventitive Healthcare:  Smoking: Never   Exercise: Starting exercise program at [facility] [employer]  Smoking History Noted  Objective Findings:  Vitals: BP 139/82 mmHg  Pulse 77  Temp(Src) 98.2 F (36.8 C) (Oral)  Ht 4\' 11"  (1.499 m)  Wt 179 lb (81.194 kg)  BMI 36.13 kg/m2  SpO2 97%  Gen: NAD HEENT: thyroid nonpalpable  CV: RRR w/o m/r/g, pulses +2 b/l Resp: CTAB w/ normal respiratory effort Foot Exam: Pulses 2+; No Ulcers, bruises or cuts; Monofilament testing: Sensation intact b/l   Assessment & Plan:   Hypertension Blood pressure currently well-controlled - Continue Hyzaar 50/12.5 - Continue ASA 81 mg - Follow-up in 3 months  Diverticulosis Complete resolution of left lower quadrant abdominal pain due to recent diverticulitis - Continue to monitor  HLD (hyperlipidemia) Tolerating Lipitor well - Continue 40 mg daily at bedtime  Prediabetes A1c improved 5.8.   - Will defer ophthalmology evaluation until approved for [facility] card our entrance

## 2016-04-19 NOTE — Addendum Note (Signed)
Addended by: Olam Idler on: 04/19/2016 11:27 AM   Modules accepted: Orders

## 2016-04-19 NOTE — Assessment & Plan Note (Signed)
A1c improved 5.8.   - Will defer ophthalmology evaluation until approved for [facility] card our entrance

## 2016-09-12 ENCOUNTER — Ambulatory Visit: Payer: Self-pay | Admitting: Gynecology

## 2016-09-12 DIAGNOSIS — Z0289 Encounter for other administrative examinations: Secondary | ICD-10-CM

## 2016-09-27 ENCOUNTER — Other Ambulatory Visit: Payer: Self-pay | Admitting: Obstetrics and Gynecology

## 2016-09-27 DIAGNOSIS — Z1231 Encounter for screening mammogram for malignant neoplasm of breast: Secondary | ICD-10-CM

## 2016-10-12 ENCOUNTER — Ambulatory Visit
Admission: RE | Admit: 2016-10-12 | Discharge: 2016-10-12 | Disposition: A | Discharge status: Home / Self Care | Payer: No Typology Code available for payment source | Source: Ambulatory Visit | Attending: Obstetrics and Gynecology | Admitting: Obstetrics and Gynecology

## 2016-10-12 ENCOUNTER — Encounter (HOSPITAL_COMMUNITY): Payer: Self-pay

## 2016-10-12 ENCOUNTER — Ambulatory Visit (HOSPITAL_COMMUNITY)
Admission: RE | Admit: 2016-10-12 | Discharge: 2016-10-12 | Disposition: A | Discharge status: Home / Self Care | Payer: Self-pay | Source: Ambulatory Visit | Attending: Obstetrics and Gynecology | Admitting: Obstetrics and Gynecology

## 2016-10-12 VITALS — BP 132/84 | Temp 97.7°F | Ht 63.0 in | Wt 180.2 lb

## 2016-10-12 DIAGNOSIS — Z1231 Encounter for screening mammogram for malignant neoplasm of breast: Secondary | ICD-10-CM

## 2016-10-12 DIAGNOSIS — Z1239 Encounter for other screening for malignant neoplasm of breast: Secondary | ICD-10-CM

## 2016-10-12 HISTORY — DX: Hyperlipidemia, unspecified: E78.5

## 2016-10-12 HISTORY — DX: Diverticulitis of intestine, part unspecified, without perforation or abscess without bleeding: K57.92

## 2016-10-12 NOTE — Patient Instructions (Signed)
Explained breast self awareness to [facility]. Patient did not need a Pap smear today due to last Pap smear was in October 2017 per patient. Let her know B[facility]CP will cover Pap smears every 3 years unless has a history of abnormal Pap smears. Referred patient to the [facility] for a screening mammogram. Appointment scheduled for Thursday, October 12, 2016 at 1200. Let patient know the [facility] will follow up with her within the next couple weeks with results by letter or phone. Sheila [name] verbalized understanding.  Hubert Raatz, Arvil Chaco, RN 10:04 AM

## 2016-10-12 NOTE — Progress Notes (Signed)
No complaints today.   Pap Smear: Pap smear not completed today. Last Pap smear was in October 2017 [facility] and normal per patient. Per patient has no history of an abnormal Pap smear. No Pap smear results are in EPIC.  Physical exam: Breasts Breasts symmetrical. No skin abnormalities bilateral breasts. No nipple retraction bilateral breasts. No nipple discharge bilateral breasts. No lymphadenopathy. No lumps palpated bilateral breasts. No complaints of pain or tenderness on exam. Referred patient to the [facility] for a screening mammogram. Appointment scheduled for Thursday, October 12, 2016 at 1200.        Pelvic/Bimanual No Pap smear completed today since last Pap smear was in October 2017 per patient. Pap smear not indicated per BCCCP guidelines.   Smoking History: Patient has never smoked.  Patient Navigation: Patient education provided. Access to services provided for patient through [facility] program. Spanish interpreter provided.   Colorectal Cancer Screening: Per patient had a colonoscopy completed 12 years ago. No complaints today.  Used Spanish interpreter [employer] from [place].

## 2016-10-16 ENCOUNTER — Encounter (HOSPITAL_COMMUNITY): Payer: Self-pay | Admitting: *Deleted

## 2017-04-13 ENCOUNTER — Encounter (HOSPITAL_COMMUNITY): Payer: Self-pay

## 2017-04-18 ENCOUNTER — Encounter: Payer: Self-pay | Admitting: Gynecology

## 2017-05-29 ENCOUNTER — Ambulatory Visit (INDEPENDENT_AMBULATORY_CARE_PROVIDER_SITE_OTHER): Payer: Self-pay | Admitting: Gynecology

## 2017-05-29 ENCOUNTER — Encounter: Payer: Self-pay | Admitting: Gynecology

## 2017-05-29 VITALS — BP 132/80 | Ht 63.0 in | Wt 181.0 lb

## 2017-05-29 DIAGNOSIS — Z86018 Personal history of other benign neoplasm: Secondary | ICD-10-CM

## 2017-05-29 DIAGNOSIS — R102 Pelvic and perineal pain unspecified side: Secondary | ICD-10-CM

## 2017-05-29 DIAGNOSIS — R5383 Other fatigue: Secondary | ICD-10-CM

## 2017-05-29 DIAGNOSIS — Z780 Asymptomatic menopausal state: Secondary | ICD-10-CM

## 2017-05-29 DIAGNOSIS — R454 Irritability and anger: Secondary | ICD-10-CM

## 2017-05-29 LAB — CBC WITH DIFFERENTIAL/PLATELET
BASOS ABS: 76 {cells}/uL (ref 0–200)
Basophils Relative: 1 %
EOS PCT: 3 %
Eosinophils Absolute: 228 cells/uL (ref 15–500)
HEMATOCRIT: 37.5 % (ref 35.0–45.0)
Hemoglobin: 12.4 g/dL (ref 11.7–15.5)
LYMPHS PCT: 34 %
Lymphs Abs: 2584 cells/uL (ref 850–3900)
MCH: 28.6 pg (ref 27.0–33.0)
MCHC: 33.1 g/dL (ref 32.0–36.0)
MCV: 86.6 fL (ref 80.0–100.0)
MONO ABS: 456 {cells}/uL (ref 200–950)
MPV: 10.8 fL (ref 7.5–12.5)
Monocytes Relative: 6 %
NEUTROS PCT: 56 %
Neutro Abs: 4256 cells/uL (ref 1500–7800)
Platelets: 357 10*3/uL (ref 140–400)
RBC: 4.33 MIL/uL (ref 3.80–5.10)
RDW: 14 % (ref 11.0–15.0)
WBC: 7.6 10*3/uL (ref 3.8–10.8)

## 2017-05-29 LAB — TSH: TSH: 2.37 mIU/L

## 2017-05-29 NOTE — Progress Notes (Signed)
    Patient is a 56 year old gravida 4 para 3 Ab1 (elective termination) who with her last pregnancy she had had a tubal ligation the month before the tubal ligation before they discovered that she was pregnant. This was in [place]. Patient stated she had a full gynecological exam in November 2017 to include a normal Pap smear and she stated that her mammogram was done last year was normal as well.  The patient is new to the practice. Her main complaint stating that she had not had a menstrual cycle for almost 10 months in the year 2017 and then in April of this year she spotted for a few days. She has no hot flashes only mood swing and tiredness and fatigue. Her PCP Hasser on a hypertension medication. She has informing that in the past in [place] they told her that she had a fibroid uterus.  Exam: Gen. appearance well-developed well-nourished female with the above mentioned complaint Back: No CVA tenderness Abdomen: Soft nontender no rebound or guarding, pimple center lower abdomen. She stated that when she had her tubal ligation the got infected in [place]. Pelvic: Bartholin urethra Skene was within normal limits Vagina: No lesions or discharge Cervix: No lesions or discharge Uterus upper limits of normal although difficult to evaluate due to patient's abdominal girth Cervix: No palpable masses or tenderness although limited because of the reason outlined above Rectal exam: Not done  Assessment/plan: Menopausal patient questionable dysfunction uterine bleeding will check [address] along with TSH because of her tiredness and fatigue as well as a hemoglobin A1c along with a CBC and a hemoglobin A1c and a pelvic ultrasound. She'll return back to the office next week to discuss these results and consideration of endometrial biopsy depending on endometrial stripe since she is on normal replacement therapy. Literature information on the menopause was provided in Spanish as well as on hormone replacement  therapy.

## 2017-05-29 NOTE — Patient Instructions (Signed)
Menopausia y terapia de reemplazo hormonal (Menopause and Hormone Replacement Therapy) QU ES LA TERAPIA DE REEMPLAZO HORMONAL? La terapia de reemplazo hormonal (TRH) es el uso de hormonas artificiales (sintticas) para reemplazar las hormonas que el cuerpo deja de producir durante la menopausia. La menopausia es la etapa normal de la vida cuando los perodos menstruales desaparecen y los ovarios dejan de producir hormonas femeninas, estrgenos y progesterona. Esta falta de hormonas puede afectar la salud y causar sntomas indeseables. La TRH puede aliviar algunos de esos sntomas. CULES SON MIS OPCIONES PARA UNA TRH? La TRH puede consistir en hormonas sintticas de estrgeno y progesterona o puede consistir solamente en estrgeno (terapia de estrgenos solamente). Usted y su mdico decidirn qu tipo de TRH es la ms adecuada para usted. Si elige tener una TRH y tiene tero, generalmente se prescribe estrgeno y progesterona. La terapia de estrgenos solamente se usa para las mujeres que no tienen tero. Las siguientes son algunas de las opciones para una TRH:  Pldoras.  Parches.  Geles.  Aerosoles.  Cremas vaginales.  Anillo vaginal.  Dispositivos vaginales. La cantidad de hormonas que tome y el tiempo en que deba tomarlas depender de su salud en particular. Es importante:  Comenzar una TRH con la dosificacin ms baja posible.  Interrumpa la TRH tan pronto como su mdico se lo indique.  Colabore con su mdico para estar bien informado y sentirse cmodo con sus decisiones. CULES SON LOS BENEFICIOS DE LA TRH? La TRH puede reducir la frecuencia y la gravedad de los sntomas de la menopausia. Los beneficios de la TRH pueden variar segn los sntomas de la menopausia que tenga, la gravedad de estos y su salud general. La TRH puede ayudarle a mejorar los siguientes sntomas de la menopausia:  Sofocones y sudores nocturnos. Estos consisten en una sensacin de calor que se  extiende por el rostro y el cuerpo. La piel se puede volver roja, como ruborizada. Los sudores nocturnos son oleadas de calor que ocurren al estar dormida o tratando de dormir.  Prdida de masa sea (osteoporosis). El organismo pierde calcio ms rpido despus de la menopausia, lo que debilita a los huesos. Esto puede aumentar el riesgo de huesos rotos (fracturas).  Sequedad vaginal. La membrana de la vagina se hace ms delgada y seca lo que puede causar dolor durante las relaciones sexuales o causar infecciones, ardor o picazn.  Infecciones en las vas urinarias.  Incontinencia urinaria. Esta es la disminucin de la capacidad de controlar la orina.  Irritabilidad.  Problemas de memoria de corto plazo. CULES SON LOS RIESGOS DE LA TRH? Los riesgos de la TRH pueden variar segn su salud y su historia clnica. Los riesgos de la TRH tambin dependen de si usted recibe estrgeno y progesterona o solamente estrgeno.La TRH puede aumentar el riesgo de:  Tener prdidas. Estas ocurren cuando la vagina pierde repentinamente una pequea cantidad de sangre.  Cncer de endometrio. Este cncer ocurre en la membrana del tero (endometrio).  Cncer de mama.  Aumenta la densidad del tejido de las mamas. Esto puede dificultar la deteccin de cncer de mamas con una radiografa de mamas (mamografa).  Ictus.  Infarto de miocardio.  Cogulos sanguneos.  Enfermedad de la vescula biliar. Los riesgos de la TRH pueden ser mayores si tiene alguna de las siguientes afecciones:  Cncer de endometrio.  Enfermedad heptica.  Cardiopata.  Cncer de mama.  Antecedentes de cogulos sanguneos.  Antecedentes de ictus. CMO DEBO CUIDARME CUANDO ESTOY REALIZANDO UNA TRH?  Tome los   medicamentos de venta libre y los recetados solamente como se lo haya indicado el mdico.  Hgase mamografas, exmenes plvicos y chequeos con la frecuencia que le indique el mdico.  Hgase pruebas de Papanicolu  con la frecuencia que le indique el mdico. A esta prueba tambin se la denomina "Pap". Es una prueba de deteccin que se utiliza para detectar signos de cncer en el cuello del tero y la vagina. La prueba de Papanicolu tambin puede identificar la presencia de infeccin o cambios precancerosos. Las pruebas de Papanicolaou se pueden realizar:  Cada 3 aos, a partir de los 21 aos.  Cada 5 aos, a partir de los 30 aos, en combinacin con las pruebas que se realizan para detectar la presencia del virus del papiloma humano (VPH).  Con mayor o menor frecuencia segn las afecciones mdicas que tenga, su edad y otros factores de riesgo.  Es su responsabilidad retirar el resultado de la prueba de Papanicolu. Consulte a su mdico o en el departamento donde se realice el procedimiento cundo estarn listos los resultados.  Concurra a todas las visitas de control como se lo haya indicado el mdico. Esto es importante. CUNDO DEBO BUSCAR ATENCIN MDICA? Hable con el mdico en los siguientes casos:  Tiene alguno de estos sntomas:  Dolor o hinchazn en las piernas.  Falta de aire.  Dolor en el pecho.  Bultos o cambios en las mamas o en las axilas.  Hablar arrastrando las palabras.  Dolor, ardor o sangrado al orinar.  Tiene alguno de estos sntomas:  Hemorragia vaginal inusual.  Mareos o dolores de cabeza.  Adormecimiento o debilidad en cualquier parte de los brazos o de las piernas.  Dolor en el abdomen. Esta informacin no tiene como fin reemplazar el consejo del mdico. Asegrese de hacerle al mdico cualquier pregunta que tenga. Document Released: 05/08/2008 Document Revised: 03/13/2016 Document Reviewed: 05/24/2015 Elsevier Interactive Patient Education  2017 [employer] La menopausia (Menopause) La menopausia es el perodo normal de la vida en el cual los perodos menstruales cesan por completo. Se completa cuando no se tienen 12 perodos menstruales consecutivos. Ocurre  generalmente en mujeres entre los 40 y los 55 aos. Muy rara vez una mujer tiene la menopausia antes de los 40 aos. En la menopausia, los ovarios dejan de producir las hormonas femeninas estrgeno y progesterona. Esto puede causar sntomas indeseables y tambin afectar a la salud. A veces los sntomas aparecen entre 4 y 5 aos antes de que comience la menopausia. No existe una relacin entre la menopausia y:  Los anticonceptivos orales.  La cantidad de hijos que tuvo.  La raza.  La edad en que comenzaron los perodos menstruales (menarca). Las mujeres que fuman mucho y las que son muy delgadas pueden desarrollar la menopausia a una edad ms temprana. CAUSAS  Los ovarios dejan de producir las hormonas femeninas estrgeno y progesterona.  Otras causas son:  Ciruga en la que se extirpan ambos ovarios.  Los ovarios que dejan de funcionar sin un motivo conocido.  Tumores de la glndula pituitaria en el cerebro.  Enfermedades que afectan los ovarios y la produccin de hormonas.  Radioterapia en el abdomen o la pelvis.  Quimioterapia que afecte a los ovarios. SNTOMAS  Acaloramiento.  Sudoracin nocturna.  Disminucin del deseo sexual.  Sequedad vaginal y adelgazamiento de la vagina, lo que causa relaciones sexuales dolorosas.  Sequedad de la piel y aparicin de arrugas.  Dolores de cabeza.  Cansancio.  Irritabilidad.  Problemas de memoria.  Aumento de peso.    Infeccin de la vejiga.  Aparicin de vello en el rostro y el pecho.  Infertilidad. Los sntomas ms graves pueden incluir:  Prdida de masa sea osteoporosis) que favorece la rotura de huesos(fracturas).  Depresin.  Endurecimiento y estrechamiento de las arterias (aterosclerosis) lo que puede causar infarto e ictus. DIAGNSTICO  El perodo menstrual no aparece por 12 meses seguidos.  Examen fsico.  Estudios hormonales de la sangre. TRATAMIENTO Hay muchas opciones de tratamiento y casi tantas  dudas como opciones existen. La decisin de tratar o no los cambios que trae la menopausia es una decisin que realiza el profesional de acuerdo con cada persona en particular. El profesional comentar las opciones de tratamiento con usted. Juntos podrn decidir qu tratamiento es el mejor para usted. Las opciones de tratamiento pueden incluir:  Terapia hormonal (estrgenos y progesterona).  Medicamentos no hormonales.  Tratamiento de los sntomas individuales con medicamentos (por ejemplo antidepresivos para la depresin).  Algunos medicamentos herbales pueden ayudar en sntomas especficos.  Psicoterapia con un psiclogo o psiquiatra.  Terapia grupal.  Cambios en el estilo de vida, como:  Consumir una dieta saludable.  Actividad fsica regular.  Limitar el consumo de cafena y alcohol.  Control del estrs y meditacin.  No realizar tratamiento. INSTRUCCIONES PARA EL CUIDADO EN EL HOGAR  Tome todos los medicamentos como el mdico le indique.  Descanse y duerma lo suficiente.  Haga ejercicios regularmente.  Consuma una dieta que contenga calcio (bueno para los huesos) y productos derivados de la soja (actan como estrgenos).  Evite las bebidas alcohlicas.  No fume.  Si tiene sofocones, vstase en capas.  Tome suplementos de calcio y vitamina D para fortalecer los huesos.  Puede utilizar lubricantes y humectantes de venta libre para la sequedad vaginal.  En algunos casos la terapia de grupo podr ayudarla.  La acupuntura puede ser de ayuda en ciertos casos. SOLICITE ATENCIN MDICA SI:  No est segura de estar en la menopausia.  Tiene sntomas de menopausia y necesita asesoramiento y tratamiento.  Tiene 55 aos y an tiene perodos menstruales.  Tiene dolor durante las relaciones sexuales.  La menopausia se ha completado (no ha tenido el perodo menstrual durante 12 meses) y tiene un sangrado vaginal.  Necesita ser derivada a un especialista (gineclogo,  psiquiatra, o psiclogo) para realizar un tratamiento. SOLICITE ATENCIN MDICA DE INMEDIATO SI:  Siente una depresin intensa e incontrolable.  Tiene una hemorragia vaginal abundante.  Siente y cree que se ha fracturado un hueso.  Siente dolor al orinar.  Siente dolor en el pecho o en la pierna.  Tiene latidos cardacos rpidos (palpitaciones).  Siente dolor de cabeza intenso.  Tiene problemas de visin.  Siente un bulto en la mama.  Siente un dolor abdominal intenso o indigestin. Esta informacin no tiene como fin reemplazar el consejo del mdico. Asegrese de hacerle al mdico cualquier pregunta que tenga. Document Released: 01/01/2007 Document Revised: 07/23/2013 Document Reviewed: 06/19/2013 Elsevier Interactive Patient Education  2017 [employer]  

## 2017-05-30 LAB — HEMOGLOBIN A1C
Hgb A1c MFr Bld: 5.4 % (ref <5.7)
MEAN PLASMA GLUCOSE: 108 mg/dL

## 2017-05-30 LAB — FOLLICLE STIMULATING HORMONE: FSH: 47.1 m[IU]/mL

## 2017-06-04 ENCOUNTER — Ambulatory Visit (INDEPENDENT_AMBULATORY_CARE_PROVIDER_SITE_OTHER): Payer: Self-pay

## 2017-06-04 ENCOUNTER — Ambulatory Visit (INDEPENDENT_AMBULATORY_CARE_PROVIDER_SITE_OTHER): Payer: Self-pay | Admitting: Gynecology

## 2017-06-04 ENCOUNTER — Other Ambulatory Visit: Payer: Self-pay | Admitting: Gynecology

## 2017-06-04 ENCOUNTER — Encounter: Payer: Self-pay | Admitting: Gynecology

## 2017-06-04 VITALS — BP 130/78

## 2017-06-04 DIAGNOSIS — D251 Intramural leiomyoma of uterus: Secondary | ICD-10-CM

## 2017-06-04 DIAGNOSIS — N852 Hypertrophy of uterus: Secondary | ICD-10-CM

## 2017-06-04 DIAGNOSIS — Z780 Asymptomatic menopausal state: Secondary | ICD-10-CM

## 2017-06-04 DIAGNOSIS — R102 Pelvic and perineal pain: Secondary | ICD-10-CM

## 2017-06-04 NOTE — Patient Instructions (Addendum)
Colonoscopia en los adultos Colonoscopy, Adult [place] colonoscopia es un examen que se realiza para examinar todo el intestino grueso. Durante el examen, se introduce un tubo lubricado y flexible en el ano que luego se lleva hasta el recto, el colon y otras partes del intestino grueso. A menudo se realiza una colonoscopia como parte de las pruebas normales de deteccin de cncer colorrectal o en respuesta a determinados sntomas, como anemia, diarrea persistente, dolor abdominal y Pharmacologist materia fecal. El examen puede ayudar a Hydrographic surveyor y diagnosticar problemas mdicos, entre ellos, los siguientes:  Tumores.  Plipos.  Inflamacin.  Zonas de hemorragias.  Informe al mdico acerca de lo siguiente:  Cualquier alergia que tenga.  Todos los [employer], incluidos vitaminas, hierbas, gotas oftlmicas, cremas y medicamentos de venta libre.  Cualquier problema previo que usted o los miembros de su familia hayan tenido con anestsicos.  Enfermedades de la sangre que tenga.  Cirugas previas.  Cualquier enfermedad que tenga.  Cualquier problema que haya tenido para defecar. Cules son los riesgos? En general, se trata de un procedimiento seguro. Sin embargo, pueden ocurrir complicaciones, por ejemplo:  Hemorragia.  Un desgarro intestinal.  Ardelia Mems reaccin a los medicamentos administrados durante el examen.  Infeccin (infrecuente).  Qu ocurre antes del procedimiento? Restricciones en las comidas y bebidas Siga las indicaciones del mdico respecto de las comidas y bebidas, las cuales pueden incluir lo siguiente:  Unos das antes del procedimiento, siga una dieta con bajo contenido de [place]. Evite los frutos secos, las semillas, las frutas disecadas, las frutas crudas y las verduras.  Siga una dieta de lquidos transparentes durante 1 a 3das antes del procedimiento. Beba solamente lquidos transparentes, como caldo o sopa transparentes, caf negro o t, jugos  transparentes, refrescos o bebidas deportivas transparentes, gelatina y helados de [place]. Evite los lquidos que contengan colorantes rojos o morados.  El da del procedimiento, no coma ni beba nada durante las 2horas previas a su realizacin o durante el lapso que el mdico le recomiende.  Preparado intestinal Si le recetaron un preparado intestinal por va oral para limpiar el colon:  Tmelo como se lo haya indicado el mdico. Desde el da anterior al procedimiento, tendr que beber una gran cantidad de lquido medicinal. El lquido har que elimine muchas heces blandas hasta que sean casi claras o de color verdoso claro.  Si la piel o el ano se le irritan debido a la diarrea, puede usar estos productos para Human resources officer irritacin: ? Human resources officer, como las toallitas hmedas para adultos con aloe y vitaminaE. ? Un producto que [employer], como la vaselina.  Si vomita mientras toma el preparado intestinal, descanse durante un mximo de 57minutos y empiece a tomarlo nuevamente. Si los vmitos continan y no puede tomar el preparado sin vomitar, llame al mdico.  Instrucciones generales  Consulte a su mdico si debe cambiar o suspender los medicamentos que toma habitualmente. Esto es muy importante si toma medicamentos para la diabetes o anticoagulantes.  Haga planes para que una persona lo lleve a su casa desde el hospital o la clnica. Qu ocurre durante el procedimiento?  Pueden colocarle una va intravenosa (IV) en una de las venas.  Recibir un medicamento como ayuda para relajarse (sedante).  Para reducir el riesgo de infecciones: ? El equipo mdico se lavar o se desinfectar las manos. ? Le lavarn la zona anal con jabn.  Le pedirn que se recueste de costado con las rodillas flexionadas.  El mdico lubricar  un tubo largo, delgado y flexible. El tubo tendr Carlota Raspberry y Hali Marry en el extremo.  Le introducirn el tubo en el ano.  El tubo se mover suavemente  a travs del recto y el colon.  Le pondrn aire en el colon para mantenerlo abierto. Es posible que sienta presin o clicos.  La cmara se usar para tomar Clinical research associate.  Pueden extraerle Radford Pax de tejido del cuerpo para examinarla con un microscopio (biopsia). Si se detecta cualquier problema posible, se enviar el tejido a un laboratorio para ser [employer].  Si se encuentran pequeos plipos, el mdico puede extirparlos y Oceanographer para detectar si hay presencia de clulas cancerosas.  Lentamente se retirar el tubo que le introdujeron en el ano. Este procedimiento puede variar segn el mdico y el hospital. Sander Nephew sucede despus del procedimiento?  Le controlarn la presin arterial, la frecuencia cardaca, la frecuencia respiratoria y Retail buyer de oxgeno en la sangre hasta que haya desaparecido el efecto de los medicamentos administrados.  No conduzca vehculos durante las 24horas posteriores al examen.  Es posible que encuentre una pequea cantidad de sangre en la materia fecal.  Es posible que elimine gases y tenga clicos abdominales o meteorismo leves debido al aire que se [place] para inflar el colon durante el examen.  Es su responsabilidad retirar [employer] del procedimiento. Consulte a su mdico o en el departamento donde se realice el procedimiento cundo estarn [employer]. Esta informacin no tiene Marine scientist el consejo del mdico. Asegrese de hacerle al mdico cualquier pregunta que tenga. Document Released: 08/30/2005 Document Revised: 11/02/2016 Document Reviewed: 02/01/2016 Elsevier Interactive Patient Education  2018 [employer]. Menopausia y terapia de reemplazo hormonal (Menopause and Hormone Replacement Therapy) QU ES LA TERAPIA DE REEMPLAZO HORMONAL? La terapia de reemplazo hormonal (TRH) es el uso de hormonas artificiales (sintticas) para Training and development officer las hormonas que el cuerpo deja de producir  durante la menopausia. La menopausia es la etapa normal de la vida cuando los perodos [employer]ogg desaparecen y los ovarios dejan de producir hormonas femeninas, estrgenos y Immunologist. Esta falta de hormonas puede afectar la salud y causar sntomas indeseables. La TRH puede aliviar algunos de esos sntomas. CULES SON MIS OPCIONES PARA UNA TRH? La TRH puede consistir en hormonas sintticas de Oceanographer y Immunologist o puede consistir solamente en estrgeno (terapia de estrgenos solamente). Usted y su mdico decidirn qu tipo de TRH es la ms [place] para usted. Si elige Lucilla Edin TRH y tiene tero, generalmente se prescribe estrgeno y progesterona. La terapia de estrgenos solamente se [place] para las mujeres que no tienen tero. Las siguientes son algunas de las opciones para una TRH:  Pldoras.  Parches.  Geles.  Aerosoles.  Cremas vaginales.  Anillo vaginal.  Dispositivos vaginales. La cantidad de hormonas que tome y Physiological scientist en que deba tomarlas depender de su salud en particular. Es importante:  Comenzar una TRH con la dosificacin ms baja posible.  Interrumpa la TRH tan pronto como su mdico se lo indique.  Colabore con su mdico para estar bien informado y sentirse cmodo con sus decisiones. CULES SON LOS BENEFICIOS DE LA TRH? La TRH puede reducir la frecuencia y la gravedad de los sntomas de la menopausia. Los beneficios de la TRH pueden variar segn los sntomas de la menopausia que tenga, la gravedad de estos y su salud general. La TRH puede ayudarle a mejorar los siguientes sntomas de la menopausia:  Sofocones y sudores nocturnos. Estos  consisten en una sensacin de calor que se extiende por el rostro y el cuerpo. La piel se puede volver roja, como ruborizada. Los sudores nocturnos son oleadas de calor que ocurren al estar dormida o tratando de dormir.  Prdida de masa sea (osteoporosis). El organismo pierde calcio ms rpido despus de la menopausia, lo  que debilita a los [place]. Esto puede aumentar el riesgo de huesos rotos (fracturas).  Sequedad vaginal. La membrana de la vagina se hace ms delgada y seca lo que puede causar dolor durante las relaciones sexuales o causar infecciones, ardor o picazn.  Infecciones en las vas urinarias.  Incontinencia urinaria. Esta es la disminucin de la capacidad de Chief Technology Officer la [place].  Irritabilidad.  Problemas de memoria de [employer] Partners. CULES SON LOS RIESGOS DE LA TRH? Los riesgos de la TRH pueden variar segn su salud y su historia clnica. Los riesgos de la TRH tambin dependen de si usted recibe estrgeno y progesterona o solamente estrgeno.La TRH puede aumentar el riesgo de:  Tener prdidas. Estas ocurren cuando la vagina pierde repentinamente una pequea cantidad de [place].  Cncer de endometrio. Este cncer ocurre en la membrana del tero (endometrio).  Cncer de mama.  Aumenta la densidad del tejido de las [place]. Esto puede dificultar la deteccin de cncer de mamas con una radiografa de mamas (mamografa).  Ictus.  Infarto de miocardio.  Cogulos sanguneos.  Enfermedad de la vescula biliar. Los riesgos de la TRH pueden ser [employer] si tiene alguna de las siguientes afecciones:  Hotel manager de endometrio.  Enfermedad heptica.  Cardiopata.  Cncer de mama.  Antecedentes de cogulos sanguneos.  Antecedentes de ictus. CMO DEBO CUIDARME CUANDO ESTOY REALIZANDO UNA TRH?  Tome los medicamentos de venta libre y los recetados solamente como se lo haya indicado el mdico.  Hgase mamografas, exmenes plvicos y chequeos con la frecuencia que le indique el mdico.  Hgase pruebas de Papanicolu con la frecuencia que le indique el mdico. A esta prueba tambin se la denomina "Pap". Es una prueba de deteccin que se utiliza para Hydrographic surveyor signos de cncer en el cuello del tero y la vagina. La prueba de Papanicolu tambin puede identificar la presencia de infeccin o cambios  precancerosos. Las pruebas de Papanicolaou se pueden Optometrist: ? Cada 3 aos, a partir de los 21 aos. ? Cada 5 aos, a partir de los 30 aos, en combinacin con las pruebas que se realizan para Product manager presencia del virus del Engineer, technical sales (VPH). ? Con mayor o menor frecuencia segn las afecciones mdicas que tenga, su edad y otros factores de [place].  Es su responsabilidad retirar el resultado de la prueba de Papanicolu. Consulte a su mdico o en el departamento donde se realice el procedimiento cundo estarn [employer].  Concurra a todas las visitas de control como se lo haya indicado el mdico. Esto es importante.  Juncal? Hable con el mdico en los siguientes casos:  Tiene alguno de estos sntomas: ? Dolor o hinchazn en las piernas. ? Falta de aire. ? Dolor en el pecho. ? Bultos o cambios en las mamas o en las [place]. ? Hablar arrastrando las palabras. ? Dolor, ardor o sangrado al [employer].  Tiene alguno de estos sntomas: ? Hemorragia vaginal inusual. ? Mareos o dolores de [place]. ? Adormecimiento o debilidad en cualquier parte de los brazos o de las piernas. ? Dolor en el abdomen. Esta informacin no tiene Marine scientist el consejo del mdico. Asegrese de hacerle al  mdico cualquier pregunta que tenga. Document Released: 05/08/2008 Document Revised: 03/13/2016 Document Reviewed: 05/24/2015 Elsevier Interactive Patient Education  2017 [place] menopausia (Menopause) La menopausia es el perodo normal de la vida en el cual los perodos menstruales cesan por completo. Se completa cuando no se tienen 12 perodos menstruales consecutivos. Ocurre generalmente en mujeres entre los 40 y los 24 aos. Muy rara vez una mujer tiene la menopausia antes de los 40 [place]. En la menopausia, los ovarios dejan de producir las hormonas femeninas estrgeno y Immunologist. Esto puede causar sntomas indeseables y Technical brewer a Technical sales engineer.  A veces los sntomas aparecen entre 4 y 5 aos antes de que comience la menopausia. No existe una relacin [employer] y:  [place] anticonceptivos orales.  La cantidad de hijos que tuvo.  La raza.  La edad en que comenzaron los perodos menstruales (menarca). Las mujeres que fuman mucho y las que son muy delgadas pueden desarrollar la menopausia a una edad ms temprana. CAUSAS  Los ovarios dejan de producir las hormonas femeninas estrgeno y Immunologist.  Otras causas son: ? [place] en la que se extirpan ambos ovarios. ? Los ovarios que dejan de funcionar sin un motivo conocido. ? Tumores de la glndula pituitaria en el cerebro. ? Enfermedades que Continental Airlines ovarios y la produccin de hormonas. ? Radioterapia en el abdomen o la pelvis. ? Quimioterapia que afecte a los ovarios.  SNTOMAS  Acaloramiento.  Sudoracin nocturna.  Disminucin del deseo sexual.  Sequedad vaginal y adelgazamiento de la vagina, lo que causa relaciones sexuales dolorosas.  Sequedad de la piel y aparicin de Glass blower/designer.  Dolores de [place].  Cansancio.  Irritabilidad.  Problemas de memoria.  Aumento de [place].  Infeccin de la vejiga.  Aparicin de vello en el rostro y [place].  Infertilidad. Los sntomas ms graves pueden incluir:  Prdida de masa sea osteoporosis) que favorece la rotura de huesos(fracturas).  Depresin.  Endurecimiento y Public librarian de las arterias (aterosclerosis) lo que puede causar infarto e ictus. DIAGNSTICO  El perodo menstrual no aparece por 12 meses seguidos.  Examen fsico.  Estudios hormonales de Herbalist.  TRATAMIENTO Hay muchas opciones de tratamiento y casi tantas dudas como opciones existen. La decisin de tratar o no los cambios que trae la menopausia es una decisin que realiza el profesional de acuerdo con cada Advertising copywriter. El Management consultant las opciones de tratamiento con usted. Juntos podrn decidir qu tratamiento es  el mejor para usted. Las opciones de tratamiento pueden incluir:  Terapia hormonal (estrgenos y Immunologist).  Medicamentos no hormonales.  Tratamiento de los sntomas individuales con medicamentos (por ejemplo antidepresivos para la depresin).  Algunos medicamentos herbales pueden ayudar en sntomas especficos.  Psicoterapia con un psiclogo o psiquiatra.  Terapia grupal.  Cambios en el estilo de vida, como: ? Consumir una dieta saludable. ? Actividad fsica regular. ? Limitar el consumo de cafena y alcohol. ? Control del estrs y Public affairs consultant.  No realizar tratamiento. [place] todos los medicamentos como el mdico le indique.  Descanse y duerma lo suficiente.  Haga ejercicios regularmente.  Consuma una dieta que contenga calcio (bueno para los [place]) y productos derivados de la soja (actan como estrgenos).  Evite las bebidas alcohlicas.  No fume.  Si tiene sofocones, vstase en capas.  Tome suplementos de calcio y vitamina D para fortalecer los [place].  Puede utilizar lubricantes y humectantes de venta libre para la sequedad vaginal.  En algunos  casos la terapia de grupo podr ayudarla.  La acupuntura puede ser de ayuda en ciertos casos.  SOLICITE ATENCIN MDICA SI:  No est segura de estar en la menopausia.  Tiene sntomas de [place] y [place] asesoramiento y Clinical research associate.  Tiene 46 aos y an tiene Chartered certified accountant.  Tiene dolor durante las [employer].  La menopausia se ha completado (no ha tenido el perodo menstrual durante 12 meses) y tiene un sangrado vaginal.  Necesita ser derivada a un especialista (gineclogo, psiquiatra, o psiclogo) para Arts administrator.  SOLICITE ATENCIN MDICA DE INMEDIATO SI:  Siente una depresin intensa e incontrolable.  Tiene una hemorragia vaginal abundante.  Siente y cree que se ha fracturado un hueso.  Siente dolor al [employer].  Siente  dolor en el pecho o en la pierna.  Tiene latidos cardacos rpidos (palpitaciones).  Siente dolor de cabeza intenso.  Tiene problemas de visin.  Siente un bulto en la mama.  Siente un dolor abdominal intenso o indigestin.  Esta informacin no tiene Marine scientist el consejo del mdico. Asegrese de hacerle al mdico cualquier pregunta que tenga. Document Released: 01/01/2007 Document Revised: 07/23/2013 Document Reviewed: 06/19/2013 Elsevier Interactive Patient Education  2017 [place] uterinos (Uterine Fibroids) Los fibromas uterinos son masas (tumores) de tejido que pueden desarrollarse en el vientre (tero). Tambin se los [employer]. Este tipo de tumor no es Radio broadcast assistant (benigno) y no se disemina a Airline pilot del cuerpo fuera de la zona plvica, la cual se encuentra entre los huesos de la cadera. En ocasiones, los fibromas pueden crecer en las trompas de [place], en el cuello del tero o en las estructuras de soporte (ligamentos) que rodean el tero. Una mujer puede tener uno o ms fibromas. Los fibromas pueden tener diferente tamao y [place], y crecer en distintas partes del tero. Algunos pueden crecer hasta volverse bastante grandes. La mayora no requiere tratamiento mdico. CAUSAS Un fibroma puede desarrollarse cuando una nica clula uterina contina creciendo (se multiplica). La mayora de las clulas del cuerpo humano tienen un mecanismo de control que impide que se multipliquen sin control. [place] los sntomas se pueden incluir los siguientes:  Hemorragias intensas durante la menstruacin.  Prdidas de sangre o [employer].  Dolor y opresin en la pelvis.  Problemas de la vejiga, como necesidad de Garment/textile technologist con ms frecuencia (polaquiuria) o necesidad imperiosa de Garment/textile technologist.  Incapacidad para reproducir (infertilidad).  Abortos espontneos. DIAGNSTICO Los fibromas uterinos se diagnostican con un examen  fsico. El mdico puede palpar los tumores grumosos durante un examen plvico. Pueden realizarse ecografas y Ardelia Mems resonancia magntica para determinar el tamao y la ubicacin de los fibromas, as como la cantidad. TRATAMIENTO El tratamiento puede incluir lo siguiente:  Observacin cautelosa. Esto requiere que el mdico controle el fibroma para saber si crece o se achica. Siga las recomendaciones del mdico respecto de la frecuencia con la que debe realizarse los controles.  Medicamentos hormonales. Pueden tomarse por va oral o administrarse a travs de un dispositivo intrauterino (DIU).  [place]. ? Extirpacin de los fibromas (miomectoma) o del tero (histerectoma). ? Suprimir la irrigacin sangunea a los fibromas (embolizacin de la arteria uterina). Si los fibromas le traen problemas de fertilidad y tiene deseos de quedar [place], el mdico puede recomendar su extirpacin. INSTRUCCIONES PARA EL CUIDADO EN EL HOGAR  Concurra a todas las visitas de control como se lo haya indicado el mdico. Esto es importante.  [employer] medicamentos de [place] y  los recetados solamente como se lo haya indicado el mdico. ? Si le recetaron un tratamiento hormonal, tome los medicamentos hormonales exactamente como se lo indicaron.  Consulte al [employer] sobre tomar comprimidos de hierro y Garment/textile technologist la cantidad de verduras de hoja color verde oscuro en la dieta. Estas medidas pueden ayudar a Transport planner de hierro en la [place], que pueden verse afectados por las hemorragias menstruales intensas.  Preste mucha atencin a la menstruacin e informe al mdico si hay algn cambio, por ejemplo: ? Aumento del flujo de sangre que le exige el uso de ms compresas o tampones que los que utiliza normalmente cada mes. ? Un cambio en la cantidad de [employer] dura la menstruacin cada mes. ? Un cambio en los sntomas asociados con la [place], como clicos abdominales o dolor de espalda. SOLICITE  ATENCIN MDICA SI:  Tiene dolor plvico, dolor de espalda o clicos abdominales que los medicamentos no Engineer, petroleum.  Observa un aumento del sangrado entre y [employer].  Empapa los tampones o las compresas en el trmino de media hora o [place].  Se siente mareada, muy cansada o dbil. SOLICITE ATENCIN MDICA DE INMEDIATO SI:  Se desmaya.  El dolor plvico aumenta repentinamente. Esta informacin no tiene Marine scientist el consejo del mdico. Asegrese de hacerle al mdico cualquier pregunta que tenga. Document Released: 11/20/2005 Document Revised: 03/13/2016 Document Reviewed: 05/19/2014 Elsevier Interactive Patient Education  [employer].

## 2017-06-04 NOTE — Progress Notes (Signed)
   Patient is a 56 year old gravida 4 para 3 Ab1 (elective termination) who was seen the office in June 26 with perimenopausal symptoms she had not had a menstrual cycle in quite some time. She has stated   In 2017 she did not have a cycle for 10 months in April of this year she spotted for a few days. She has no vasomotor symptoms not on any hormonal replacement therapy many years ago in [place] she had a tubal ligation and irregular last that she had been pregnant for month. Patient is due for annual exam later this year.  Labs done at last office visit demonstrated that her [facility] was in the menopausal range with a value of 47.1. Her hemoglobin A1c was 5.4 and her TSH was 2.37.these blood tests were also drawn because of her tiredness and fatigue. Her CBC was also normal as well.  Ultrasound today:  Uterus measured 12.8 x 8.9 x 7.3 cm with endometrial stripe of 5.5 mm. A large intramural fibroid measuring 7 x 6.2 x 5.6 cm and a second one measuring 3.9 x 3.3 mm slightly displacing the uterine cavity. Right ovary was not seen. Left ovary was normal and no fluid in the cul-de-sac.  Assessment/plan: Menopausal patient with no ultrasound in the past to compare. Patient was instructed to maintain a calendar and to return back in 6 months for her annual exam as well as follow-up ultrasound of these fibroid. If she has any form of bleeding to write it on her calendar and to bring with her to the office visit at which time she may need an endometrial biopsy. Since she is not having any vasomotor symptoms she will not need to be place on any hormone replacement therapy at the present time. I did provide her with literature information on the menopause as well as hormone replacement therapy and on fibroids in Spanish. Patient stated that in the past she is still for from diverticulosis. She  Had a colonoscopy over 10 years at another facility and it was recommended that she have one this year. I provided her with  names of community gastroenterologist.

## 2017-06-11 ENCOUNTER — Encounter (HOSPITAL_COMMUNITY): Payer: Self-pay

## 2017-07-01 IMAGING — CT CT ABD-PELV W/ CM
2 of 5 series · 9 of 46 positions shown, 10 images · IV contrast (Iodine)
Comparison: 07/06/2006

CLINICAL DATA: Acute left abdominal pain with nausea for several
weeks.

EXAM:
CT ABDOMEN AND PELVIS WITH CONTRAST
TECHNIQUE: Multidetector CT imaging of the abdomen and pelvis was performed
using the standard protocol following bolus administration of
intravenous contrast.
CONTRAST:  100mL OMNIPAQUE IOHEXOL 300 MG/ML  SOLN

[Series 201: routine, idose (2) · axial · 0.85mm/px · z∈[+115,+485]mm · 6 of 98 slices shown, 7 images]
[im 12/98  soft-tissue]
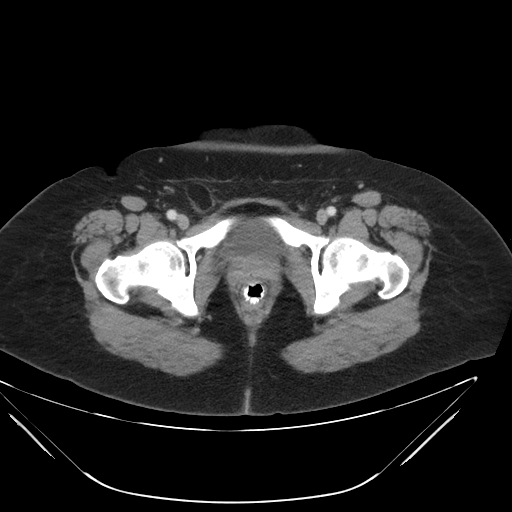
[im 12/98  bone]
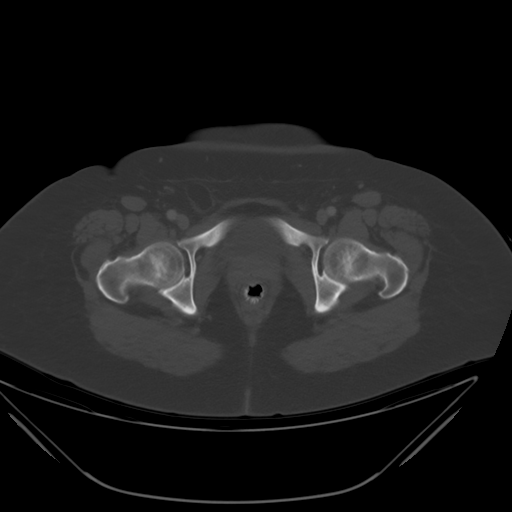
[im 29/98  soft-tissue]
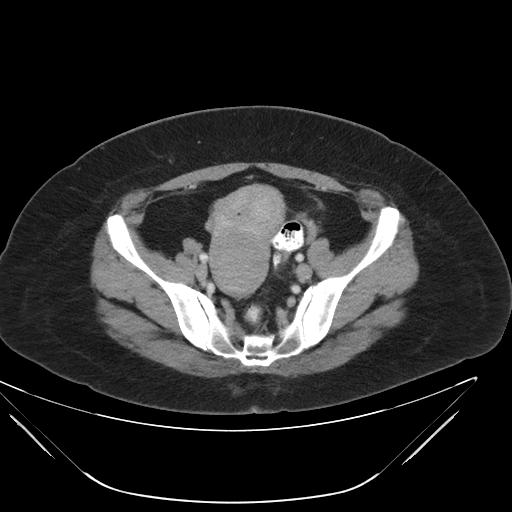
[im 40/98  soft-tissue]
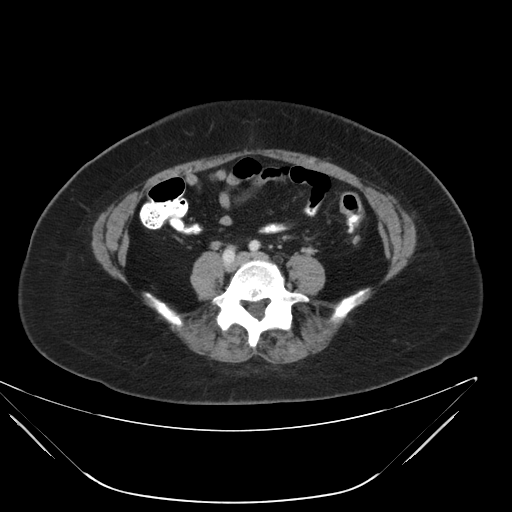
[im 58/98  soft-tissue]
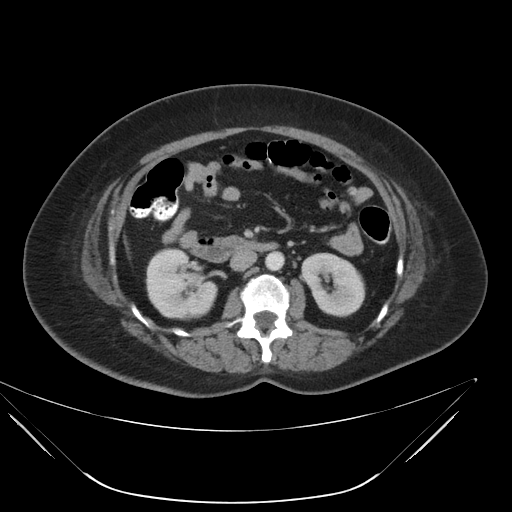
[im 75/98  soft-tissue]
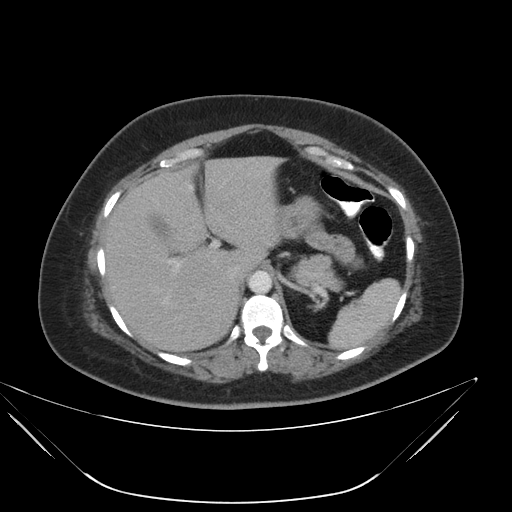
[im 86/98  soft-tissue]
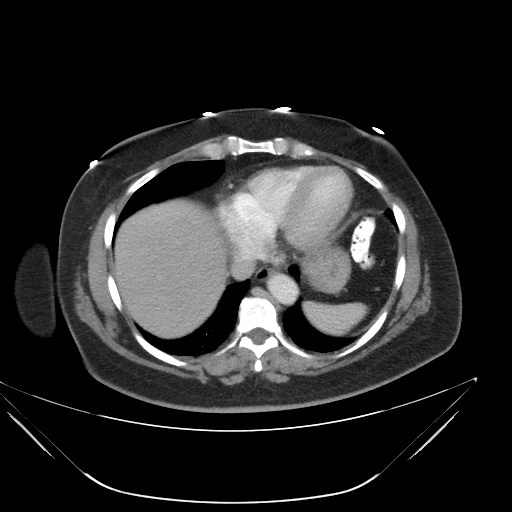

[Series 203: coronals, idose (2) · coronal · 0.45mm/px · 3 of 109 slices shown]
[im 37/109  soft-tissue]
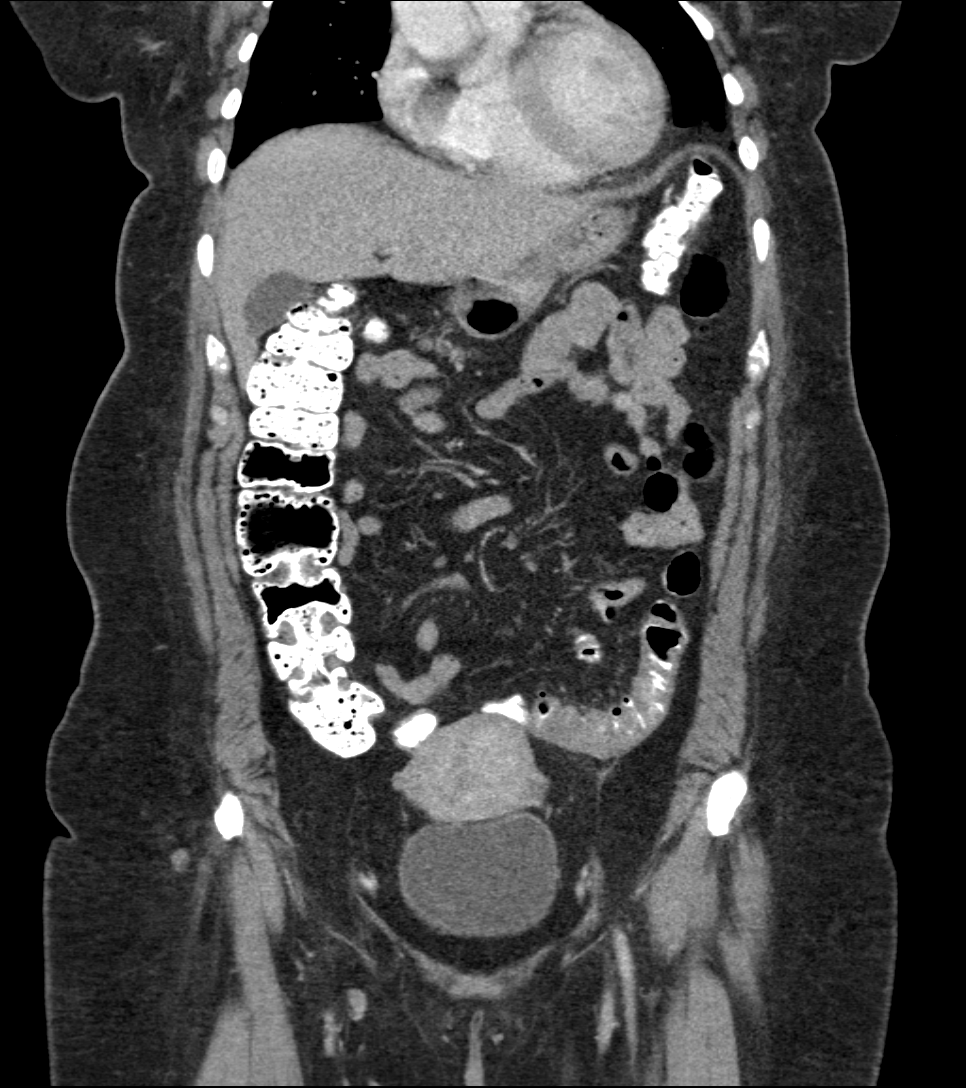
[im 49/109  soft-tissue]
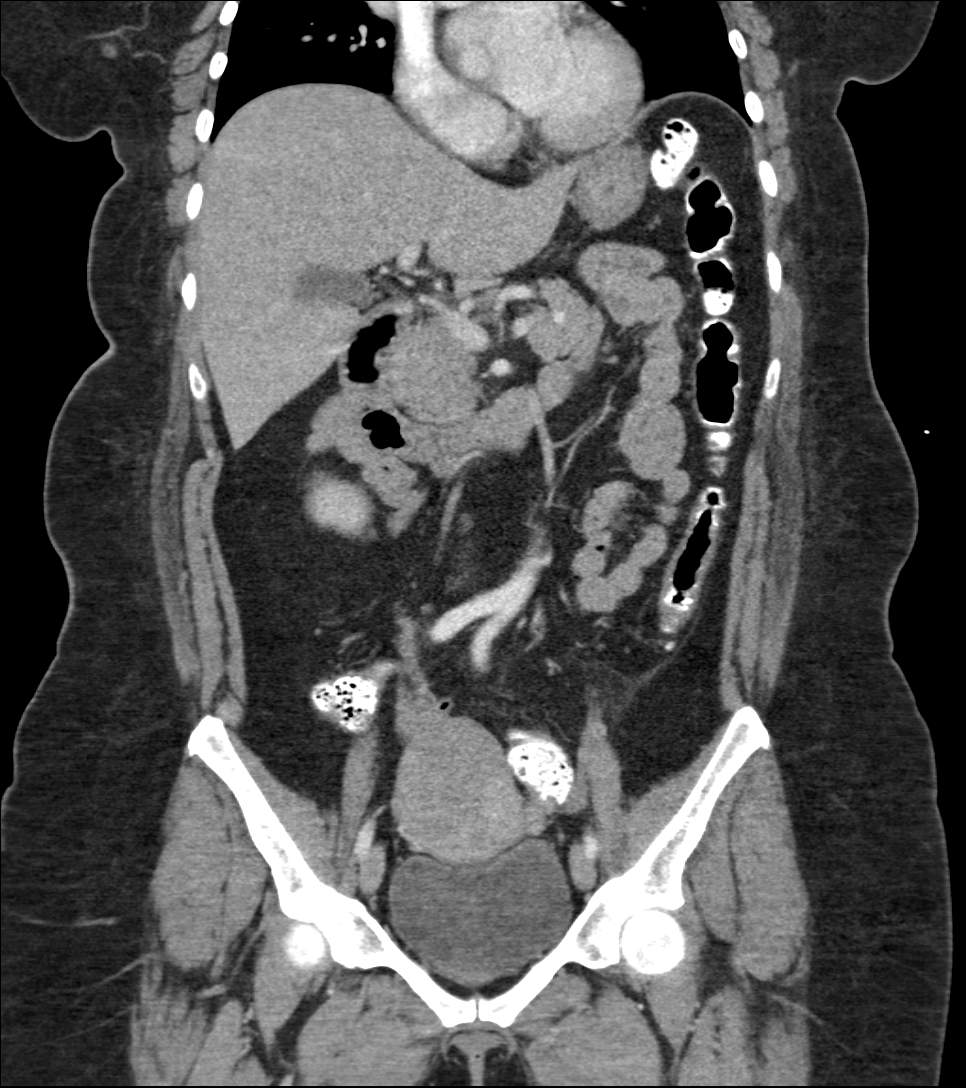
[im 61/109  soft-tissue]
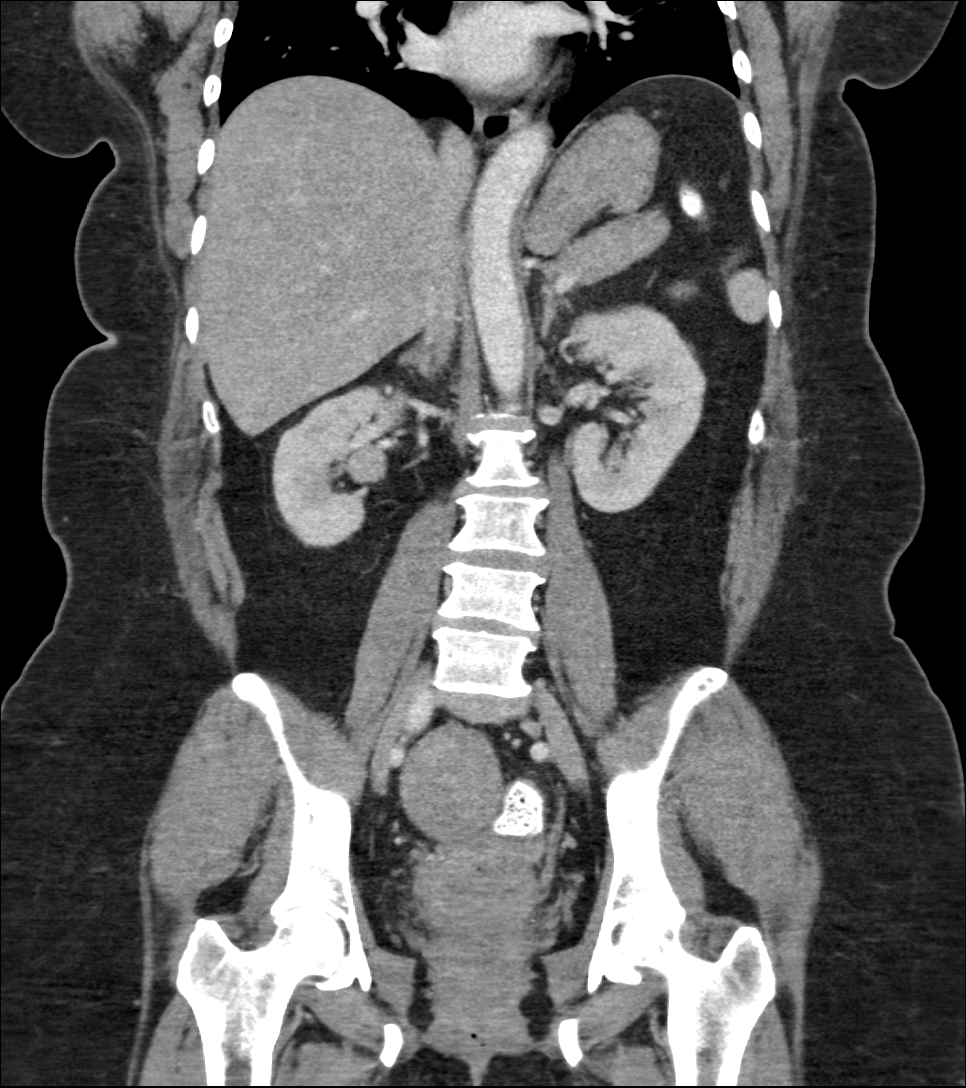

[9 of 46 positions shown; findings below may reference images not displayed]

FINDINGS: Lower chest: Minor bibasilar atelectasis. Normal heart size. No
pericardial or pleural effusion.

Hepatobiliary: No masses or other significant abnormality.

Pancreas: No mass, inflammatory changes, or other significant
abnormality.

Spleen: Within normal limits in size and appearance.

Adrenals/Urinary Tract: No masses identified. No evidence of
hydronephrosis.

Stomach/Bowel: Negative for bowel obstruction, significant
dilatation, or ileus. Proximal sigmoid colon demonstrates focal mild
wall thickening and surrounding diverticulosis with minor
pericolonic strandy edema and free fluid compatible with mild focal
acute diverticulitis, images 65 through 70. No associated
perforation, fluid collection, abscess, or obstruction. normal
appendix.

Vascular/Lymphatic: Aortoiliac tortuosity noted. Negative for
aneurysm. No adenopathy.

Reproductive: Uterus is enlarged secondary to fibroids. Dominant
posterior lower uterine fibroid measures 6.3 cm. No adnexal
abnormality. No significant pelvic free fluid.

Other: Small fat containing right inguinal hernia. Interval repair
of the previous umbilical hernia without recurrence. Abdominal wall
appears intact.

Musculoskeletal: Degenerative changes of the lower lumbar facets.
Mild lower lumbar degenerative disc disease at L4-5 and L5-S1. No
acute compression fracture. No acute osseous finding.
IMPRESSION: Mild focal acute sigmoid diverticulitis in the left lower quadrant
without perforation, abscess, or obstruction.

Other chronic and postoperative findings as above.

These results will be called to the ordering clinician or
representative by the Radiologist Assistant, and communication
documented in the PACS or zVision Dashboard.

## 2017-09-10 ENCOUNTER — Encounter (HOSPITAL_COMMUNITY): Payer: Self-pay

## 2017-09-18 ENCOUNTER — Ambulatory Visit: Payer: Self-pay | Admitting: Women's Health

## 2017-12-03 ENCOUNTER — Other Ambulatory Visit: Payer: Self-pay | Admitting: Obstetrics & Gynecology

## 2017-12-03 DIAGNOSIS — D251 Intramural leiomyoma of uterus: Secondary | ICD-10-CM

## 2017-12-05 ENCOUNTER — Other Ambulatory Visit: Payer: Self-pay

## 2017-12-05 ENCOUNTER — Encounter: Payer: Self-pay | Admitting: Obstetrics & Gynecology

## 2017-12-10 ENCOUNTER — Encounter: Payer: Self-pay | Admitting: Obstetrics & Gynecology

## 2017-12-10 ENCOUNTER — Ambulatory Visit (INDEPENDENT_AMBULATORY_CARE_PROVIDER_SITE_OTHER): Payer: Self-pay | Admitting: Obstetrics & Gynecology

## 2017-12-10 ENCOUNTER — Ambulatory Visit (INDEPENDENT_AMBULATORY_CARE_PROVIDER_SITE_OTHER): Payer: Self-pay

## 2017-12-10 VITALS — BP 148/90 | Ht 60.0 in | Wt 182.0 lb

## 2017-12-10 DIAGNOSIS — Z9851 Tubal ligation status: Secondary | ICD-10-CM

## 2017-12-10 DIAGNOSIS — N951 Menopausal and female climacteric states: Secondary | ICD-10-CM

## 2017-12-10 DIAGNOSIS — D219 Benign neoplasm of connective and other soft tissue, unspecified: Secondary | ICD-10-CM

## 2017-12-10 DIAGNOSIS — N914 Secondary oligomenorrhea: Secondary | ICD-10-CM

## 2017-12-10 DIAGNOSIS — Z01411 Encounter for gynecological examination (general) (routine) with abnormal findings: Secondary | ICD-10-CM

## 2017-12-10 DIAGNOSIS — D251 Intramural leiomyoma of uterus: Secondary | ICD-10-CM

## 2017-12-10 MED ORDER — MEDROXYPROGESTERONE ACETATE 5 MG PO TABS: 5.0000 mg | ORAL_TABLET | Freq: Every day | ORAL | 4 refills | Status: DC

## 2017-12-10 NOTE — Progress Notes (Signed)
Sheila [name] May 04, 1961 [id number]   History:    57 y.o. G4P3A1 Married.  S/P Bilateral Tubal Ligation  RP:  Established patient presenting  for annual gyn exam and Pelvic US f/u Fibroids and Perimenopause  HPI: Oligomenorrhea with last menstrual period May 08, 2017.  Status post bilateral tubal ligation.  History of uterine fibroids.  No pelvic pain.  Normal vaginal secretions.  Urine and bowel movements normal.  Breasts normal.  Last screening mammogram November 2017.  Health labs with family physician.  Body mass index 35.54.  Blood pressure today 148/90 no history of chronic hypertension.,   Past medical history,surgical history, family history and social history were all reviewed and documented in the EPIC chart.  Gynecologic History Patient's last menstrual period was 05/08/2017 (approximate). Contraception: tubal ligation Last Pap: 05/2016. Results were: normal Last mammogram: 10/2016. Results were: normal Colonoscopy 2005  Obstetric History OB History  Gravida Para Term Preterm AB Living  5 3 0 0 2 3  SAB TAB Ectopic Multiple Live Births  2 0 0        # Outcome Date GA Lbr Len/2nd Weight Sex Delivery Anes PTL Lv  5 SAB           4 SAB           3 Para           2 Para           1 Para                ROS: A ROS was performed and pertinent positives and negatives are included in the history.  GENERAL: No fevers or chills. HEENT: No change in vision, no earache, sore throat or sinus congestion. NECK: No pain or stiffness. CARDIOVASCULAR: No chest pain or pressure. No palpitations. PULMONARY: No shortness of breath, cough or wheeze. GASTROINTESTINAL: No abdominal pain, nausea, vomiting or diarrhea, melena or bright red blood per rectum. GENITOURINARY: No urinary frequency, urgency, hesitancy or dysuria. MUSCULOSKELETAL: No joint or muscle pain, no back pain, no recent trauma. DERMATOLOGIC: No rash, no itching, no lesions. ENDOCRINE: No polyuria, polydipsia, no  heat or cold intolerance. No recent change in weight. HEMATOLOGICAL: No anemia or easy bruising or bleeding. NEUROLOGIC: No headache, seizures, numbness, tingling or weakness. PSYCHIATRIC: No depression, no loss of interest in normal activity or change in sleep pattern.     Exam:   BP (!) 148/90   Ht 5' (1.524 m)   Wt 182 lb (82.6 kg)   LMP 05/08/2017 (Approximate)   BMI 35.54 kg/m   Body mass index is 35.54 kg/m.  General appearance : Well developed well nourished female. No acute distress HEENT: Eyes: no retinal hemorrhage or exudates,  Neck supple, trachea midline, no carotid bruits, no thyroidmegaly Lungs: Clear to auscultation, no rhonchi or wheezes, or rib retractions  Heart: Regular rate and rhythm, no murmurs or gallops Breast:Examined in sitting and supine position were symmetrical in appearance, no palpable masses or tenderness,  no skin retraction, no nipple inversion, no nipple discharge, no skin discoloration, no axillary or supraclavicular lymphadenopathy Abdomen: no palpable masses or tenderness, no rebound or guarding Extremities: no edema or skin discoloration or tenderness  Pelvic: Vulva normal  Bartholin, Urethra, Skene Glands: Within normal limits             Vagina: No gross lesions or discharge  Cervix: No gross lesions or discharge.  Pap reflex done.  Uterus  AV, upper normal size, shape and  consistency, non-tender and mobile  Adnexa  Without masses or tenderness  Anus and perineum  normal   Pelvic US today: T/V and T/A images.  Anteverted uterus measuring 9.5 x 6.58 x 6.57 cm.  Intramural fibroids measuring 3.6 x 3.4 x 3.3 cm and 4.8 x 6.2 x 4.5 cm. Endometrial line 5.3 mm. Right and left ovaries normal.  No free fluid in the posterior cul-de-sac.  Pelvic US 06/04/2017:  Uterus 12.8 x 8.9 x 7.3 cm with endometrial stripe of 5.5 mm. A large intramural fibroid measuring 7 x 6.2 x 5.6 cm and another one 3.9 x 3.3 cm.   Assessment/Plan:  57 y.o. female for  annual exam   1. Encounter for gynecological examination with abnormal finding Gynecologic exam normal with uterus upper normal size.  Pap reflex done.  Breast exam normal.  Patient will schedule screening mammogram now.  Health labs with family physician.  Due for a colonoscopy.  2. Perimenopause Perimenopause with last menstrual period June 2018.  Endometrial lining normal at 5.3 mm.  Given endometrial line stable or even thinner than last ultrasound, decision to repeat an ultrasound in 3 months rather than do an endometrial biopsy.  If no spontaneous menstrual period, will take Provera 5 mg/tab. 1 tablet daily for 10 days prior to next ultrasound.  Patient understands and agree with plan. - US Transvaginal Non-OB; Future  3. Secondary oligomenorrhea - US Transvaginal Non-OB; Future  4. Fibroid Mildly decreased in size on today's ultrasound.  Patient reassured.  5. Tubal ligation status  Other orders - medroxyPROGESTERone (PROVERA) 5 MG tablet; Take 1 tablet (5 mg total) by mouth daily for 10 days.  Counseling on above issues more than 50% for 15 minutes.  Princess Bruins MD, 12:45 PM 12/10/2017

## 2017-12-11 ENCOUNTER — Telehealth: Payer: Self-pay | Admitting: *Deleted

## 2017-12-11 DIAGNOSIS — K5730 Diverticulosis of large intestine without perforation or abscess without bleeding: Secondary | ICD-10-CM

## 2017-12-11 LAB — PAP IG W/ RFLX HPV ASCU

## 2017-12-11 NOTE — Telephone Encounter (Signed)
Referral placed at [facility], they will contact pt to schedule.

## 2017-12-11 NOTE — Telephone Encounter (Signed)
-----   Message from Princess Bruins, MD sent at 12/10/2017  1:04 PM EST ----- Regarding: Refer to Gastro-Enterology Refer to [place].  Would like her to do screening [place], but patient says she doesn't want to do it because she has diverticulosis, can discuss alternatives.Marland KitchenMarland Kitchen

## 2017-12-12 ENCOUNTER — Encounter: Payer: Self-pay | Admitting: Obstetrics & Gynecology

## 2017-12-12 NOTE — Patient Instructions (Signed)
1. Encounter for gynecological examination with abnormal finding Gynecologic exam normal with uterus upper normal size.  Pap reflex done.  Breast exam normal.  Patient will schedule screening mammogram now.  Health labs with family physician.  Due for a colonoscopy.  2. Perimenopause Perimenopause with last menstrual period June 2018.  Endometrial lining normal at 5.3 mm.  Given endometrial line stable or even thinner than last ultrasound, decision to repeat an ultrasound in 3 months rather than do an endometrial biopsy.  If no spontaneous menstrual period, will take Provera 5 mg/tab. 1 tablet daily for 10 days prior to next ultrasound.  Patient understands and agree with plan. - US Transvaginal Non-OB; Future  3. Secondary oligomenorrhea - US Transvaginal Non-OB; Future  4. Fibroid Mildly decreased in size on today's ultrasound.  Patient reassured.  5. Tubal ligation status  Other orders - medroxyPROGESTERone (PROVERA) 5 MG tablet; Take 1 tablet (5 mg total) by mouth daily for 10 days.  Sheila [name] un placer conocerla hoy!

## 2017-12-14 NOTE — Telephone Encounter (Signed)
[facility] has called pt x 1

## 2017-12-28 ENCOUNTER — Inpatient Hospital Stay: Payer: Self-pay | Attending: Gynecologic Oncology | Admitting: Gynecologic Oncology

## 2017-12-28 ENCOUNTER — Encounter: Payer: Self-pay | Admitting: Gynecologic Oncology

## 2017-12-28 VITALS — BP 120/80 | HR 72 | Temp 98.6°F | Resp 20 | Wt 182.8 lb

## 2017-12-28 DIAGNOSIS — D259 Leiomyoma of uterus, unspecified: Secondary | ICD-10-CM | POA: Insufficient documentation

## 2017-12-28 DIAGNOSIS — N950 Postmenopausal bleeding: Secondary | ICD-10-CM | POA: Insufficient documentation

## 2017-12-28 DIAGNOSIS — E7800 Pure hypercholesterolemia, unspecified: Secondary | ICD-10-CM | POA: Insufficient documentation

## 2017-12-28 DIAGNOSIS — E669 Obesity, unspecified: Secondary | ICD-10-CM | POA: Insufficient documentation

## 2017-12-28 DIAGNOSIS — I10 Essential (primary) hypertension: Secondary | ICD-10-CM | POA: Insufficient documentation

## 2017-12-28 DIAGNOSIS — Z79899 Other long term (current) drug therapy: Secondary | ICD-10-CM | POA: Insufficient documentation

## 2017-12-28 NOTE — Addendum Note (Signed)
Addended by: Joylene John D on: 12/28/2017 04:11 PM   Modules accepted: Orders

## 2017-12-28 NOTE — Progress Notes (Signed)
Consult Note: Gyn-Onc  CC:  Chief Complaint  Patient presents with  . Post-menopausal bleeding    Assessment/Plan:  [name]  is a 57 y.o.  year old who is perimenopausal and has fibroids and postmenopausal bleeding.  We will follow-up the results of her biopsy. If benign, I agree with Dr Assunta Curtis plan to reimage in 3 months to monitor the fibroids which I expect to involute or remain stable. No evidence of malignancy on today's exam.  HPI:  Sheila [name] is a 57 year old P2 who was seen for postmenopausal bleeding and uterine fibroids.  The patient had an [place] drawn in June 2018 which was elevated consistent with menopausal status.  She is continued to have abnormal uterine bleeding since that time.  Pelvic ultrasound scan on June 04, 2017 showed a 12.8 cm x 8.7 cm x 7.3 cm uterus with a stripe of 5.5 mm in intramural fibroid measuring 7 cm and another measuring 4 cm.  Repeat pelvic ultrasound on December 10, 2017 showed decrease in size of the uterus now measuring 9.5 x 6.6 x 6.6 cm with the largest intramural fibroid measuring 4.8 cm.  Given the decreasing size of fibroids and stable endometrial thickness her primary provider did not sample the endometrium at that time.  However the patient has some concerns about an occult malignancy and is requesting endometrial biopsy for definitive diagnosis.  The patient is obese, she has hypertension and hyperlipidemia.  She has had a tubal ligation in the past but no other abdominal surgeries.  She does not have a family history that is concerning for Lynch syndrome or BRCA.  Current Meds:  Outpatient Encounter Medications as of 12/28/2017  Medication Sig  . ipratropium (ATROVENT) 0.06 % nasal spray Place 2 sprays into both nostrils 4 (four) times daily.  Marland Kitchen losartan-hydrochlorothiazide (HYZAAR) 50-12.5 MG tablet Take 1 tablet by mouth daily.  . medroxyPROGESTERone (PROVERA) 5 MG tablet Take 1 tablet (5 mg total) by mouth  daily for 10 days.  . [DISCONTINUED] fluticasone (FLONASE) 50 MCG/ACT nasal spray Place 1 spray into both nostrils daily.   No facility-administered encounter medications on file as of 12/28/2017.     Allergy:  Allergies  Allergen Reactions  . Acetaminophen Other (See Comments)    dizziness  . Ace Inhibitors Cough    Social Hx:   Social History   Socioeconomic History  . Marital status: Married    Spouse name: Not on file  . Number of children: Not on file  . Years of education: Not on file  . Highest education level: Not on file  Social Needs  . Financial resource strain: Not on file  . Food insecurity - worry: Not on file  . Food insecurity - inability: Not on file  . Transportation needs - medical: Not on file  . Transportation needs - non-medical: Not on file  Occupational History  . Not on file  Tobacco Use  . Smoking status: Never Smoker  . Smokeless tobacco: Never Used  Substance and Sexual Activity  . Alcohol use: No  . Drug use: No  . Sexual activity: Yes    Partners: Male    Birth control/protection: Surgical    Comment: 1st intercourse- 17, partners- 2, married- 4 yrs   Other Topics Concern  . Not on file  Social History Narrative   ** Merged History Encounter **       ** Merged History Encounter **        Past Surgical Hx:  Past  Surgical History:  Procedure Laterality Date  . HERNIA REPAIR    . TUBAL LIGATION      Past Medical Hx:  Past Medical History:  Diagnosis Date  . Diverticulitis   . Fibroids   . High cholesterol   . Hyperlipidemia   . Hypertension     Past Gynecological History:  SVD x 2, fibroids Patient's last menstrual period was 05/08/2017 (approximate).  Family Hx:  Family History  Problem Relation Age of Onset  . Cancer Mother        stomach  . Pneumonia Father   . Diabetes Sister   . Diabetes Brother   . Hypertension Sister   . Hyperlipidemia Sister   . Hypertension Sister     Review of  Systems:  Constitutional  Feels well,    ENT Normal appearing ears and nares bilaterally Skin/Breast  No rash, sores, jaundice, itching, dryness Cardiovascular  No chest pain, shortness of breath, or edema  Pulmonary  No cough or wheeze.  Gastro Intestinal  No nausea, vomitting, or diarrhoea. No bright red blood per rectum, no abdominal pain, change in bowel movement, or constipation.  Genito Urinary  No frequency, urgency, dysuria, + postmenopausal bleeding. No bulk symptoms. Musculo Skeletal  No myalgia, arthralgia, joint swelling or pain  Neurologic  No weakness, numbness, change in gait,  Psychology  No depression, anxiety, insomnia.   Vitals:  Blood pressure 120/80, pulse 72, temperature 98.6 F (37 C), temperature source Oral, resp. rate 20, weight 182 lb 12.8 oz (82.9 kg), last menstrual period 05/08/2017, SpO2 100 %, unknown if currently breastfeeding.  Physical Exam: WD in NAD Neck  Supple NROM, without any enlargements.  Lymph Node Survey No cervical supraclavicular or inguinal adenopathy Cardiovascular  Pulse normal rate, regularity and rhythm. S1 and S2 normal.  Lungs  Clear to auscultation bilateraly, without wheezes/crackles/rhonchi. Good air movement.  Skin  No rash/lesions/breakdown  Psychiatry  Alert and oriented to person, place, and time  Abdomen  Normoactive bowel sounds, abdomen soft, non-tender and obese without evidence of hernia.  Back No CVA tenderness Genito Urinary  Vulva/vagina: Normal external female genitalia.   No lesions. No discharge or bleeding.  Bladder/urethra:  No lesions or masses, well supported bladder  Vagina: normal  Cervix: Normal appearing, no lesions.  Uterus:  10cm bulky, mobile, no parametrial involvement or nodularity.  Adnexa: no masses. Rectal  Good tone, no masses no cul de sac nodularity.  Extremities  No bilateral cyanosis, clubbing or edema.  PROCEDURE: Endometrial biopsy Preop dx: postmenopausal  bleeding Postop dx: same Procedure: endometrial biopsy Surgeon: Everitt Amber Specimen: endometrial biopsy Complications: none EBL: minimal Procedure: The procedure was described the patient provided verbal consent and timeout was performed.  The speculum was placed in the vagina and the cervix was visualized.  It was grasped with a single-tooth tenaculum.  The endometrial Pipelle was passed with a single pass to the uterine fundus at 7 cm.  A small amount of endometrial tissue was extracted with suction.  The propel and tenaculum were removed as was the speculum.  There was minimal blood loss.  Patient tolerated the procedure well.  The specimen was sent for surgical pathology.   Donaciano Eva, MD  12/28/2017, 12:17 PM

## 2017-12-28 NOTE — Patient Instructions (Signed)
Please notify Dr Denman George at phone number [phone] if you have questions.  Please follow-up with Dr Dellis Filbert in March as planned for your repeat US.  Dr Serita Grit office will notify you with your pathology results.

## 2018-01-01 ENCOUNTER — Telehealth: Payer: Self-pay

## 2018-01-01 NOTE — Telephone Encounter (Signed)
Told Almyra Free that she can let patient know that the biopsy showed no cancer or precancerous cells which is good news.  She just needs to f/u with Dr. Dellis Filbert in April for [place] and visit.  No need to f/u with Dr. Denman George. Almyra Free verbalized understanding.

## 2018-01-11 ENCOUNTER — Encounter: Payer: Self-pay | Admitting: Internal Medicine

## 2018-01-11 NOTE — Telephone Encounter (Signed)
Pt scheduled on 03/08/18 with Dr.Gessner

## 2018-01-14 ENCOUNTER — Ambulatory Visit: Payer: No Typology Code available for payment source | Admitting: Gynecologic Oncology

## 2018-03-08 ENCOUNTER — Ambulatory Visit: Payer: Self-pay | Admitting: Internal Medicine

## 2018-03-12 ENCOUNTER — Encounter: Payer: Self-pay | Admitting: Obstetrics & Gynecology

## 2018-03-13 ENCOUNTER — Ambulatory Visit: Payer: Self-pay | Admitting: Obstetrics & Gynecology

## 2018-03-13 ENCOUNTER — Other Ambulatory Visit: Payer: Self-pay

## 2018-04-04 ENCOUNTER — Other Ambulatory Visit: Payer: Self-pay

## 2018-04-04 ENCOUNTER — Ambulatory Visit: Payer: Self-pay | Admitting: Obstetrics & Gynecology

## 2018-07-28 ENCOUNTER — Encounter (HOSPITAL_COMMUNITY): Payer: Self-pay | Admitting: Student

## 2018-07-28 ENCOUNTER — Emergency Department (HOSPITAL_COMMUNITY)
Admission: EM | Admit: 2018-07-28 | Discharge: 2018-07-28 | Disposition: A | Discharge status: Home / Self Care | Payer: Self-pay | Attending: Emergency Medicine | Admitting: Emergency Medicine

## 2018-07-28 ENCOUNTER — Other Ambulatory Visit: Payer: Self-pay

## 2018-07-28 DIAGNOSIS — Z79899 Other long term (current) drug therapy: Secondary | ICD-10-CM | POA: Insufficient documentation

## 2018-07-28 DIAGNOSIS — I10 Essential (primary) hypertension: Secondary | ICD-10-CM | POA: Insufficient documentation

## 2018-07-28 DIAGNOSIS — E785 Hyperlipidemia, unspecified: Secondary | ICD-10-CM | POA: Insufficient documentation

## 2018-07-28 DIAGNOSIS — R040 Epistaxis: Secondary | ICD-10-CM | POA: Insufficient documentation

## 2018-07-28 MED ORDER — TRANEXAMIC ACID 1000 MG/10ML IV SOLN
500.0000 mg | Freq: Once | INTRAVENOUS | Status: AC
  Administered 2018-07-28: 500 mg via TOPICAL
  Filled 2018-07-28: qty 10

## 2018-07-28 MED ORDER — IBUPROFEN 600 MG PO TABS: 600.0000 mg | ORAL_TABLET | Freq: Four times a day (QID) | ORAL | 0 refills | Status: DC | PRN

## 2018-07-28 MED ORDER — LIDOCAINE-EPINEPHRINE (PF) 2 %-1:200000 IJ SOLN
10.0000 mL | Freq: Once | INTRAMUSCULAR | Status: AC
  Administered 2018-07-28: 10 mL
  Filled 2018-07-28: qty 20

## 2018-07-28 MED ORDER — OXYMETAZOLINE HCL 0.05 % NA SOLN
1.0000 | Freq: Once | NASAL | Status: AC
  Administered 2018-07-28: 1 via NASAL
  Filled 2018-07-28: qty 15

## 2018-07-28 MED ORDER — OXYCODONE HCL 5 MG PO TABS
5.0000 mg | ORAL_TABLET | Freq: Once | ORAL | Status: AC
  Administered 2018-07-28: 5 mg via ORAL
  Filled 2018-07-28: qty 1

## 2018-07-28 NOTE — Discharge Instructions (Signed)
You were seen in the emergency department today for a nosebleed.  A packing was placed into your nose.  You will need to follow-up with the ear nose and throat specialist to have this removed in 2 days, please call their office to set up an appointment.  Return to the ER sooner for new or worsening symptoms including but not limited to return of bleeding, lightheadedness, dizziness, passing out, chest pain, trouble breathing, fever, purulent/pus drainage from the nose, or any other concerns.  Google Translate:  Lo vieron hoy en el departamento de emergencias por una hemorragia nasal. Le colocaron un paquete en la nariz. Deber realizar un seguimiento con el especialista en odo, nariz y garganta para que se lo retiren en 2 das. Llame a su oficina para programar una cita.  Regrese a la sala de emergencias antes para detectar sntomas nuevos o que empeoren, incluidos, entre otros, [place], aturdimiento, [place], [employer], [employer], dificultad para respirar, fiebre, purulento / drenaje de pus de la nariz o cualquier otra inquietud.

## 2018-07-28 NOTE — ED Triage Notes (Signed)
Patient states that she awoke after coughing and began to have bleeding from her nose and mouth. Patient is profoundly nervous. States that her provider has given her a RX nose spray for sinusitis and has used more than prescribed 3 days ago but not recently.

## 2018-07-28 NOTE — ED Notes (Signed)
Spanish interpretor utilized to go over discharge instructions, pt verbalized understanding of instructions including f/u with ENT and symptoms she would need to return to ED for. Vss, pt ambulatory with nad upon discharge.

## 2018-07-28 NOTE — ED Provider Notes (Signed)
Pt signed out to me by previous provider.  She developed epistasix to L nares.  Initial insertion of rhino-rocket 4.5cm did not provide adequate hemostasis. I insert a rhino-rocket 7.5cm in to L nares and infated to 8cc of air.  Will monitor closely.    8:32 AM Pt have been monitored for the past hr with good hemostasis.  Will d/c with pain medication for potential sinus headache.  Pt understand to f/u with ENT in 2 days for recheck.  Return precaution discussed.        Domenic Moras, PA-C 07/28/18 [id number]    Sherwood Gambler, MD 07/28/18 [phone]

## 2018-07-28 NOTE — ED Provider Notes (Signed)
[facility] EMERGENCY DEPARTMENT Provider Note   CSN: [id number] Arrival date & time: 07/28/18  8921     History   Chief Complaint Chief Complaint  Patient presents with  . Epistaxis    HPI Sheila [name] is a 57 y.o. female with a hx of hyperlipidemia, HTN, and uterine fibroids who presents to the ED for epistaxis which started at 03:15. Patient states that she has had some congestion, sore throat, and cough recently. Has been utilizing Flonase to help with this.  She states that she woke up with a coughing spell got up and subsequently noted bleeding from the left nare and into the oropharynx.  She states bleeding has been constant.  No specific alleviating or aggravating factors.  She did try to apply a little bit of pressure prior to arrival.  She feels that the blood is dripping from her nose into her mouth and she is spitting it out.  She has been spitting out clots at times. She denies fever, chills, lightheadedness, dizziness, chest pain, or difficulty breathing.  She denies blood thinner use.  HPI  Past Medical History:  Diagnosis Date  . Diverticulitis   . Fibroids   . High cholesterol   . Hyperlipidemia   . Hypertension     Patient Active Problem List   Diagnosis Date Noted  . Post-menopausal bleeding 12/28/2017  . History of uterine fibroid 05/29/2017  . Menopause 05/29/2017  . Hematuria 03/06/2016  . Diverticulosis 02/18/2016  . HLD (hyperlipidemia) 06/25/2015  . Prediabetes 06/25/2015  . Nasal sinus congestion 06/25/2015  . Hypertension 04/17/2012    Past Surgical History:  Procedure Laterality Date  . HERNIA REPAIR    . TUBAL LIGATION       OB History    Gravida  5   Para  3   Term  0   Preterm  0   AB  2   Living  3     SAB  2   TAB  0   Ectopic  0   Multiple      Live Births               Home Medications    Prior to Admission medications   Medication Sig Start Date End Date Taking?  Authorizing Provider  ipratropium (ATROVENT) 0.06 % nasal spray Place 2 sprays into both nostrils 4 (four) times daily. 06/16/15   Waldemar Dickens, MD  losartan-hydrochlorothiazide (HYZAAR) 50-12.5 MG tablet Take 1 tablet by mouth daily. 04/19/16   Olam Idler, MD  medroxyPROGESTERone (PROVERA) 5 MG tablet Take 1 tablet (5 mg total) by mouth daily for 10 days. 12/10/17 12/20/17  Princess Bruins, MD    Family History Family History  Problem Relation Age of Onset  . Cancer Mother        stomach  . Pneumonia Father   . Diabetes Sister   . Diabetes Brother   . Hypertension Sister   . Hyperlipidemia Sister   . Hypertension Sister     Social History Social History   Tobacco Use  . Smoking status: Never Smoker  . Smokeless tobacco: Never Used  Substance Use Topics  . Alcohol use: No  . Drug use: No     Allergies   Acetaminophen and Ace inhibitors   Review of Systems Review of Systems  Constitutional: Negative for chills and fever.  HENT: Positive for congestion, nosebleeds and rhinorrhea.   Respiratory: Positive for cough. Negative for shortness of breath.  Cardiovascular: Negative for chest pain.  Neurological: Negative for dizziness, weakness and light-headedness.  All other systems reviewed and are negative.  Physical Exam Updated Vital Signs BP (!) 168/107 (BP Location: Left Arm)   Pulse (!) 107   Temp 98.3 F (36.8 C) (Oral)   Resp 20   Wt 81.6 kg   LMP 05/08/2017 (Approximate)   SpO2 100%   BMI 35.15 kg/m   Physical Exam  Constitutional: She appears well-developed and well-nourished.  Non-toxic appearance.  HENT:  Head: Normocephalic and atraumatic.  Right Ear: Tympanic membrane normal.  Left Ear: Tympanic membrane normal.  Nose: No nasal deformity, septal deviation or nasal septal hematoma. Epistaxis (Patient appears to have bleeding from the left nare, unable to visualize source at this time.  No active bleeding from right nare.) is observed.    Mouth/Throat: Uvula is midline.  Patient does have blood in the posterior oropharynx.  Eyes: Conjunctivae are normal. Right eye exhibits no discharge. Left eye exhibits no discharge.  Neck: Neck supple.  Cardiovascular: Regular rhythm. Tachycardia present.  No murmur heard. Pulmonary/Chest: Effort normal and breath sounds normal. No respiratory distress. She has no wheezes. She has no rhonchi. She has no rales.  Respiration even and unlabored  Abdominal: Soft. She exhibits no distension. There is no tenderness.  Neurological: She is alert.  Clear speech.   Skin: Skin is warm and dry. No rash noted.  Psychiatric: Her behavior is normal. Her mood appears anxious.  Nursing note and vitals reviewed.  ED Treatments / Results  Labs (all labs ordered are listed, but only abnormal results are displayed) Labs Reviewed - No data to display  EKG None  Radiology No results found.  Procedures .Epistaxis Management Date/Time: 07/28/2018 6:00 AM Performed by: Amaryllis Dyke, PA-C Authorized by: Amaryllis Dyke, PA-C   Consent:    Consent obtained:  Verbal   Consent given by:  Patient   Risks discussed:  Bleeding, infection, nasal injury and pain   Alternatives discussed:  No treatment and alternative treatment Anesthesia (see MAR for exact dosages):    Anesthesia method:  Topical application   Topical anesthesia: lidocaine. Procedure details:    Treatment site:  L anterior   Treatment method:  Anterior pack and nasal balloon Post-procedure details:    Assessment:  Bleeding decreased Comments:     Initial trial of application of pressure subsequent trial of gauze soaked with afrin mixed with lido 2% w/ epi, subsequent placement of rapid rhino 4.5 soaked in tranexamic acid. Rapid rhino unfortunately spontaneously came out with subsequent return of bleeding, however decreased.     (including critical care time)   Medications Ordered in ED Medications  oxymetazoline  (AFRIN) 0.05 % nasal spray 1 spray (1 spray Each Nare Given 07/28/18 0410)  lidocaine-EPINEPHrine (XYLOCAINE W/EPI) 2 %-1:200000 (PF) injection 10 mL (10 mLs Infiltration Given 07/28/18 0411)  tranexamic acid (CYKLOKAPRON) injection 500 mg (500 mg Topical Given 07/28/18 1245)     Initial Impression / Assessment and Plan / ED Course  I have reviewed the triage vital signs and the nursing notes.  Pertinent labs & imaging results that were available during my care of the patient were reviewed by me and considered in my medical decision making (see chart for details).   Patient presents to the emergency department with active epistaxis which appears to be coming from L nare and is dripping into oropharynx.  Patient appears anxious and is mildly tachycardic upon arrival.  Difficulty visualizing source of bleed  from L nare, no bleeding from R nare. Trial of epistaxis management as above with pressure, lido/epi/afrin soaked gauze, and subsequent rapid rhino.   0700: Transfer of care to Domenic Moras PA-C at change of shift- he has placed rapid rhino 7.5. Plan for patient re-assessment   Final Clinical Impressions(s) / ED Diagnoses   Final diagnoses:  Epistaxis    ED Discharge Orders    None       Amaryllis Dyke, PA-C 07/28/18 Cassia, Ankit, MD 07/29/18 [phone]

## 2018-07-29 ENCOUNTER — Emergency Department (HOSPITAL_COMMUNITY)
Admission: EM | Admit: 2018-07-29 | Discharge: 2018-07-29 | Disposition: A | Discharge status: Home / Self Care | Payer: Self-pay | Attending: Emergency Medicine | Admitting: Emergency Medicine

## 2018-07-29 ENCOUNTER — Other Ambulatory Visit: Payer: Self-pay

## 2018-07-29 ENCOUNTER — Encounter (HOSPITAL_COMMUNITY): Payer: Self-pay

## 2018-07-29 DIAGNOSIS — R040 Epistaxis: Secondary | ICD-10-CM | POA: Insufficient documentation

## 2018-07-29 DIAGNOSIS — I10 Essential (primary) hypertension: Secondary | ICD-10-CM | POA: Insufficient documentation

## 2018-07-29 DIAGNOSIS — Z79899 Other long term (current) drug therapy: Secondary | ICD-10-CM | POA: Insufficient documentation

## 2018-07-29 LAB — BASIC METABOLIC PANEL
Anion gap: 10 (ref 5–15)
BUN: 9 mg/dL (ref 6–20)
CHLORIDE: 104 mmol/L (ref 98–111)
CO2: 27 mmol/L (ref 22–32)
Calcium: 9.4 mg/dL (ref 8.9–10.3)
Creatinine, Ser: 0.66 mg/dL (ref 0.44–1.00)
GFR calc Af Amer: >60 mL/min (ref >60)
GFR calc non Af Amer: >60 mL/min (ref >60)
GLUCOSE: 108 mg/dL — AB (ref 70–99)
POTASSIUM: 3.6 mmol/L (ref 3.5–5.1)
Sodium: 141 mmol/L (ref 135–145)

## 2018-07-29 LAB — CBC
HCT: 40.3 % (ref 36.0–46.0)
HEMOGLOBIN: 12.9 g/dL (ref 12.0–15.0)
MCH: 28.7 pg (ref 26.0–34.0)
MCHC: 32 g/dL (ref 30.0–36.0)
MCV: 89.8 fL (ref 78.0–100.0)
Platelets: 323 10*3/uL (ref 150–400)
RBC: 4.49 MIL/uL (ref 3.87–5.11)
RDW: 13.5 % (ref 11.5–15.5)
WBC: 13 10*3/uL — ABNORMAL HIGH (ref 4.0–10.5)

## 2018-07-29 MED ORDER — HYDROCHLOROTHIAZIDE 25 MG PO TABS
25.0000 mg | ORAL_TABLET | Freq: Every day | ORAL | Status: DC
  Filled 2018-07-29: qty 1

## 2018-07-29 MED ORDER — OXYMETAZOLINE HCL 0.05 % NA SOLN
1.0000 | Freq: Once | NASAL | Status: AC
  Administered 2018-07-29: 1 via NASAL
  Filled 2018-07-29: qty 15

## 2018-07-29 MED ORDER — ACETAMINOPHEN 325 MG PO TABS
650.0000 mg | ORAL_TABLET | Freq: Once | ORAL | Status: AC
  Administered 2018-07-29: 650 mg via ORAL
  Filled 2018-07-29: qty 2

## 2018-07-29 MED ORDER — LOSARTAN POTASSIUM-HCTZ 50-12.5 MG PO TABS: 1.0000 | ORAL_TABLET | Freq: Every day | ORAL | 0 refills | Status: DC

## 2018-07-29 NOTE — Discharge Instructions (Signed)
Please read and follow all provided instructions.  Your diagnoses today include:  1. Left-sided epistaxis     Tests performed today include:  Blood counts and electrolytes  Vital signs. See below for your results today.   Medications prescribed:   HCTZ  Home care instructions:  Read the educational materials provided and follow any instructions contained in this packet.  If your nosebleed happens again: Pinch your nose and hold for 15 minutes without letting go.  If this does not stop the bleeding, pinch and hold for another 15 minutes.  If it continues to bleed after this, please come to the Emergency Department or see your family doctor.   Follow-up instructions: Please follow-up with the ENT doctor.   Return instructions:   Please return to the Emergency Department if you experience worsening symptoms.   Please return if you have any other emergent concerns.  Additional Information:  Your vital signs today were: BP 134/74 (BP Location: Right Arm)    Pulse 95    Temp 99.1 F (37.3 C) (Oral)    Resp 16    LMP 05/08/2017 (Approximate)    SpO2 97%  If your blood pressure (BP) was elevated above 135/85 this visit, please have this repeated by your doctor within one month. --------------

## 2018-07-29 NOTE — ED Triage Notes (Signed)
Pt was here on Saturday for nosebleed. She has nasal packing in place. This morning she began having headache and oozing blood from her packing.

## 2018-07-29 NOTE — ED Provider Notes (Signed)
[facility] EMERGENCY DEPARTMENT Provider Note   CSN: [id number] Arrival date & time: 07/29/18  1022     History   Chief Complaint Chief Complaint  Patient presents with  . Epistaxis    HPI Sheila [name] is a 57 y.o. female.  Patient presents with complaint of nosebleed.  She was seen in the emergency department for left nare epistaxis 2 days ago.  She has had worsening headache with left-sided facial pain today with bruising around the packing is in place.  She states that her throat has been sore since vomiting at onset of her nosebleed.  She denies any chest pain or shortness of breath.  She has been drinking at home and eating soup most recently yesterday.  Patient had packing placed after other measures failed at first ED visit.  She is not anticoagulated.  She denies other easy bruising or bleeding, blood in the urine or stool.  No fevers.  Reports elevated blood pressures and is supposed to be on losartan/HCTZ but is currently out.  The onset of this condition was acute. The course is constant. Aggravating factors: none. Alleviating factors: none.       Past Medical History:  Diagnosis Date  . Diverticulitis   . Fibroids   . High cholesterol   . Hyperlipidemia   . Hypertension     Patient Active Problem List   Diagnosis Date Noted  . Post-menopausal bleeding 12/28/2017  . History of uterine fibroid 05/29/2017  . Menopause 05/29/2017  . Hematuria 03/06/2016  . Diverticulosis 02/18/2016  . HLD (hyperlipidemia) 06/25/2015  . Prediabetes 06/25/2015  . Nasal sinus congestion 06/25/2015  . Hypertension 04/17/2012    Past Surgical History:  Procedure Laterality Date  . HERNIA REPAIR    . TUBAL LIGATION       OB History    Gravida  5   Para  3   Term  0   Preterm  0   AB  2   Living  3     SAB  2   TAB  0   Ectopic  0   Multiple      Live Births               Home Medications    Prior to Admission  medications   Medication Sig Start Date End Date Taking? Authorizing Provider  fluticasone (FLONASE) 50 MCG/ACT nasal spray Place 2 sprays into both nostrils as needed for allergies or rhinitis.   Yes [provider]  ibuprofen (ADVIL,MOTRIN) 600 MG tablet Take 1 tablet (600 mg total) by mouth every 6 (six) hours as needed. 07/28/18  Yes Domenic Moras, PA-C  Prenatal Vit-Fe Fumarate-FA (PRENATAL MULTIVITAMIN) TABS tablet Take 1 tablet by mouth daily at 12 noon.   Yes [provider]  ipratropium (ATROVENT) 0.06 % nasal spray Place 2 sprays into both nostrils 4 (four) times daily. Patient not taking: Reported on 07/29/2018 06/16/15   Waldemar Dickens, MD  lisinopril-hydrochlorothiazide (PRINZIDE,ZESTORETIC) 20-12.5 MG tablet Take 1 tablet by mouth daily.    [provider]  losartan-hydrochlorothiazide (HYZAAR) 50-12.5 MG tablet Take 1 tablet by mouth daily. Patient not taking: Reported on 07/29/2018 04/19/16   Olam Idler, MD  medroxyPROGESTERone (PROVERA) 5 MG tablet Take 1 tablet (5 mg total) by mouth daily for 10 days. 12/10/17 12/20/17  Princess Bruins, MD    Family History Family History  Problem Relation Age of Onset  . Cancer Mother  stomach  . Pneumonia Father   . Diabetes Sister   . Diabetes Brother   . Hypertension Sister   . Hyperlipidemia Sister   . Hypertension Sister     Social History Social History   Tobacco Use  . Smoking status: Never Smoker  . Smokeless tobacco: Never Used  Substance Use Topics  . Alcohol use: No  . Drug use: No     Allergies   Acetaminophen and Ace inhibitors   Review of Systems Review of Systems  Constitutional: Negative for fever.  HENT: Positive for facial swelling, nosebleeds, sinus pressure, sinus pain and sore throat. Negative for rhinorrhea.   Eyes: Negative for redness.  Respiratory: Negative for cough.   Cardiovascular: Negative for chest pain.  Gastrointestinal: Negative for abdominal pain,  diarrhea, nausea and vomiting.  Genitourinary: Negative for dysuria.  Musculoskeletal: Negative for myalgias.  Skin: Negative for rash.  Neurological: Positive for headaches.     Physical Exam Updated Vital Signs BP 134/74 (BP Location: Right Arm)   Pulse 95   Temp 99.1 F (37.3 C) (Oral)   Resp 16   LMP 05/08/2017 (Approximate)   SpO2 97%   Physical Exam  Constitutional: She appears well-developed and well-nourished.  HENT:  Head: Normocephalic and atraumatic.  Mouth/Throat: Oropharynx is clear and moist.  Patient with left-sided nasal packing in place, very scant oozing of blood around the left nare, mixed with some mucus.  Moderate left-sided facial tenderness.  No blood in the oropharynx.  Eyes: Conjunctivae are normal. Right eye exhibits no discharge. Left eye exhibits no discharge.  Neck: Normal range of motion. Neck supple.  Cardiovascular: Normal rate, regular rhythm and normal heart sounds.  Pulmonary/Chest: Effort normal and breath sounds normal.  Abdominal: Soft. There is no tenderness.  Neurological: She is alert.  Skin: Skin is warm and dry.  Psychiatric: She has a normal mood and affect.  Nursing note and vitals reviewed.    ED Treatments / Results  Labs (all labs ordered are listed, but only abnormal results are displayed) Labs Reviewed  CBC - Abnormal; Notable for the following components:      Result Value   WBC 13.0 (*)    All other components within normal limits  BASIC METABOLIC PANEL - Abnormal; Notable for the following components:   Glucose, Bld 108 (*)    All other components within normal limits    EKG None  Radiology No results found.  Procedures Procedures (including critical care time)  Medications Ordered in ED Medications  hydrochlorothiazide (HYDRODIURIL) tablet 25 mg (25 mg Oral Not Given 07/29/18 1229)  oxymetazoline (AFRIN) 0.05 % nasal spray 1 spray (1 spray Each Nare Given 07/29/18 1210)  acetaminophen (TYLENOL) tablet  650 mg (650 mg Oral Given 07/29/18 1513)     Initial Impression / Assessment and Plan / ED Course  I have reviewed the triage vital signs and the nursing notes.  Pertinent labs & imaging results that were available during my care of the patient were reviewed by me and considered in my medical decision making (see chart for details).     Patient seen and examined.  Will check basic labs to ensure no thrombocytopenia given bleeding is persisting.  Will remove packing to evaluate bleeding.  Vital signs reviewed and are as follows: BP 134/74 (BP Location: Right Arm)   Pulse 95   Temp 99.1 F (37.3 C) (Oral)   Resp 16   LMP 05/08/2017 (Approximate)   SpO2 97%   Packing removed at  bedside by myself.  I used a nasal speculum to visualize the inside of the nare.  There is a large amount of clotting noted.  No active bleeding.  Will reassess.  Patient was monitored in the emergency department for couple of hours without any worsening.  She was given Tylenol for her headache per her request.  She is drinking without any worsening symptoms.  At time of discharge, patient is having very minimal pink serous oozing.  I am comfortable with her going home at this time.  We discussed procedure to follow if bleeding recurs.  Discussed that if she holds pressure for 15 minutes x 2, and bleeding does not improve, she will need to return to the emergency department.  Encouraged PCP/ENT follow-up.  Final Clinical Impressions(s) / ED Diagnoses   Final diagnoses:  Left-sided epistaxis   Patient with left-sided epistaxis and return of oozing this morning.  Patient had a great amount of facial pain and headache related to the packing.  She has not had fevers or other suggestions of sinusitis at this time.  Packing was removed and patient was examined.  Minimal oozing noted.  Patient was then monitored in the emergency department for couple of hours.  CBC shows normal hemoglobin and normal platelets.  Normal  electro lites and kidney function.  Mild leukocytosis.  Patient was stable during ED stay.  After discussion with patient and family, feel comfortable with not replacing packing at this time.  Discussed what to do if bleeding recurrence and patient is aware that she may need to return for packing placement if her situation worsens.  Encouraged follow-up as above.  ED Discharge Orders         Ordered    losartan-hydrochlorothiazide [facility]) 50-12.5 MG tablet  Daily     07/29/18 1509           Carlisle Cater, PA-C 07/29/18 1536    Tegeler, Gwenyth Allegra, MD 07/29/18 [phone]

## 2019-10-07 ENCOUNTER — Other Ambulatory Visit: Payer: Self-pay

## 2019-10-09 ENCOUNTER — Other Ambulatory Visit: Payer: Self-pay

## 2019-10-09 ENCOUNTER — Ambulatory Visit (INDEPENDENT_AMBULATORY_CARE_PROVIDER_SITE_OTHER): Payer: Self-pay | Admitting: Obstetrics & Gynecology

## 2019-10-09 ENCOUNTER — Encounter: Payer: Self-pay | Admitting: Obstetrics & Gynecology

## 2019-10-09 VITALS — BP 156/84 | Ht 60.0 in | Wt 172.0 lb

## 2019-10-09 DIAGNOSIS — R102 Pelvic and perineal pain: Secondary | ICD-10-CM

## 2019-10-09 DIAGNOSIS — Z780 Asymptomatic menopausal state: Secondary | ICD-10-CM

## 2019-10-09 DIAGNOSIS — E6609 Other obesity due to excess calories: Secondary | ICD-10-CM

## 2019-10-09 DIAGNOSIS — Z01419 Encounter for gynecological examination (general) (routine) without abnormal findings: Secondary | ICD-10-CM

## 2019-10-09 DIAGNOSIS — N950 Postmenopausal bleeding: Secondary | ICD-10-CM

## 2019-10-09 DIAGNOSIS — Z6833 Body mass index (BMI) 33.0-33.9, adult: Secondary | ICD-10-CM

## 2019-10-09 NOTE — Progress Notes (Signed)
Sheila [name] 07-24-1961 [id number]   History:    58 y.o. G5P3A2L3 Married.  S/P TL.  RP:  Established patient presenting for annual gyn exam   HPI: Postmenopause, well on no HRT.  PMB with spotting x 1 in 07/2019.  Mild left lower abdominal pain when sitting down only x many months.  H/O Diverticulosis.  Urine/BMs normal.  Breasts normal.  BMI 33.59.  Health Labs here today.  Past medical history,surgical history, family history and social history were all reviewed and documented in the EPIC chart.  Gynecologic History Patient's last menstrual period was 05/08/2017 (approximate). Contraception: tubal ligation Last Pap: 12/2017. Results were: Negative Last mammogram: 10/2016. Results were: Negative Bone Density: Never Colonoscopy: 2005  Obstetric History OB History  Gravida Para Term Preterm AB Living  5 3 0 0 2 3  SAB TAB Ectopic Multiple Live Births  2 0 0        # Outcome Date GA Lbr Len/2nd Weight Sex Delivery Anes PTL Lv  5 SAB           4 SAB           3 Para           2 Para           1 Para              ROS: A ROS was performed and pertinent positives and negatives are included in the history.  GENERAL: No fevers or chills. HEENT: No change in vision, no earache, sore throat or sinus congestion. NECK: No pain or stiffness. CARDIOVASCULAR: No chest pain or pressure. No palpitations. PULMONARY: No shortness of breath, cough or wheeze. GASTROINTESTINAL: No abdominal pain, nausea, vomiting or diarrhea, melena or bright red blood per rectum. GENITOURINARY: No urinary frequency, urgency, hesitancy or dysuria. MUSCULOSKELETAL: No joint or muscle pain, no back pain, no recent trauma. DERMATOLOGIC: No rash, no itching, no lesions. ENDOCRINE: No polyuria, polydipsia, no heat or cold intolerance. No recent change in weight. HEMATOLOGICAL: No anemia or easy bruising or bleeding. NEUROLOGIC: No headache, seizures, numbness, tingling or weakness. PSYCHIATRIC: No  depression, no loss of interest in normal activity or change in sleep pattern.     Exam:   BP (!) 156/84   Ht 5' (1.524 m)   Wt 172 lb (78 kg)   LMP 05/08/2017 (Approximate)   BMI 33.59 kg/m   Body mass index is 33.59 kg/m.  General appearance : Well developed well nourished female. No acute distress HEENT: Eyes: no retinal hemorrhage or exudates,  Neck supple, trachea midline, no carotid bruits, no thyroidmegaly Lungs: Clear to auscultation, no rhonchi or wheezes, or rib retractions  Heart: Regular rate and rhythm, no murmurs or gallops Breast:Examined in sitting and supine position were symmetrical in appearance, no palpable masses or tenderness,  no skin retraction, no nipple inversion, no nipple discharge, no skin discoloration, no axillary or supraclavicular lymphadenopathy Abdomen: no palpable masses or tenderness, no rebound or guarding Extremities: no edema or skin discoloration or tenderness  Pelvic: Vulva: Normal             Vagina: No gross lesions or discharge  Cervix: No gross lesions or discharge.  Pap reflex done.  Uterus  AV, normal size, shape and consistency, non-tender and mobile  Adnexa  Without masses or tenderness  Anus: Normal   Assessment/Plan:  58 y.o. female for annual exam   1. Encounter for routine gynecological examination with Papanicolaou smear of cervix Normal  gynecologic exam in menopause.  Pap reflex done.  Breast exam normal.  Will schedule a screening mammogram now.  Schedule overdue colonoscopy.  Health labs here today. - CBC - Comp Met (CMET) - Lipid panel - TSH - VITAMIN D 25 Hydroxy (Vit-D Deficiency, Fractures)  2. Postmenopause Well on no hormone replacement.  Had 1 day of postmenopausal bleeding August 2020.  We will follow-up for pelvic ultrasound. - US Transvaginal Non-OB; Future  3. Post-menopausal bleeding Postmenopausal bleeding x1 day in August 2020.  No recurrence since then.  Normal gynecologic exam today.  Follow-up  for a pelvic ultrasound to evaluate the endometrial cavity. - US Transvaginal Non-OB; Future  4. Pelvic pain in female H/O Diverticulosis.  Refer to [facility].  No Colono x 15 yrs. - US Transvaginal Non-OB; Future  5. Class 1 obesity due to excess calories without serious comorbidity with body mass index (BMI) of 33.0 to 33.9 in adult Recommend a lower calorie/carb diet such as [employer].  Aerobic physical activities 5 times a week and weightlifting every 2 days.  Counseling on above issues and coordination of care more than 50% for 10 minutes.  Princess Bruins MD, 11:14 AM 10/09/2019

## 2019-10-10 ENCOUNTER — Encounter: Payer: Self-pay | Admitting: Obstetrics & Gynecology

## 2019-10-10 LAB — CBC
HCT: 38.7 % (ref 35.0–45.0)
Hemoglobin: 12.9 g/dL (ref 11.7–15.5)
MCH: 28.7 pg (ref 27.0–33.0)
MCHC: 33.3 g/dL (ref 32.0–36.0)
MCV: 86 fL (ref 80.0–100.0)
MPV: 11.5 fL (ref 7.5–12.5)
Platelets: 326 10*3/uL (ref 140–400)
RBC: 4.5 10*6/uL (ref 3.80–5.10)
RDW: 13.1 % (ref 11.0–15.0)
WBC: 8 10*3/uL (ref 3.8–10.8)

## 2019-10-10 LAB — COMPREHENSIVE METABOLIC PANEL
AG Ratio: 1.4 (calc) (ref 1.0–2.5)
ALT: 16 U/L (ref 6–29)
AST: 16 U/L (ref 10–35)
Albumin: 4.4 g/dL (ref 3.6–5.1)
Alkaline phosphatase (APISO): 104 U/L (ref 37–153)
BUN: 9 mg/dL (ref 7–25)
CO2: 28 mmol/L (ref 20–32)
Calcium: 9.5 mg/dL (ref 8.6–10.4)
Chloride: 105 mmol/L (ref 98–110)
Creat: 0.65 mg/dL (ref 0.50–1.05)
Globulin: 3.1 g/dL (calc) (ref 1.9–3.7)
Glucose, Bld: 90 mg/dL (ref 65–99)
Potassium: 3.9 mmol/L (ref 3.5–5.3)
Sodium: 140 mmol/L (ref 135–146)
Total Bilirubin: 0.6 mg/dL (ref 0.2–1.2)
Total Protein: 7.5 g/dL (ref 6.1–8.1)

## 2019-10-10 LAB — TSH: TSH: 2.17 mIU/L (ref 0.40–4.50)

## 2019-10-10 LAB — LIPID PANEL
Cholesterol: 247 mg/dL — ABNORMAL HIGH (ref <200)
HDL: 57 mg/dL (ref >=50)
LDL Cholesterol (Calc): 163 mg/dL (calc) — ABNORMAL HIGH
Non-HDL Cholesterol (Calc): 190 mg/dL (calc) — ABNORMAL HIGH (ref <130)
Total CHOL/HDL Ratio: 4.3 (calc) (ref <5.0)
Triglycerides: 131 mg/dL (ref <150)

## 2019-10-10 LAB — PAP IG W/ RFLX HPV ASCU

## 2019-10-10 LAB — VITAMIN D 25 HYDROXY (VIT D DEFICIENCY, FRACTURES): Vit D, 25-Hydroxy: 15 ng/mL — ABNORMAL LOW (ref 30–100)

## 2019-10-11 ENCOUNTER — Encounter: Payer: Self-pay | Admitting: Obstetrics & Gynecology

## 2019-10-11 NOTE — Patient Instructions (Signed)
1. Encounter for routine gynecological examination with Papanicolaou smear of cervix Normal gynecologic exam in menopause.  Pap reflex done.  Breast exam normal.  Will schedule a screening mammogram now.  Schedule overdue colonoscopy.  Health labs here today. - CBC - Comp Met (CMET) - Lipid panel - TSH - VITAMIN D 25 Hydroxy (Vit-D Deficiency, Fractures)  2. Postmenopause Well on no hormone replacement.  Had 1 day of postmenopausal bleeding August 2020.  We will follow-up for pelvic ultrasound. - US Transvaginal Non-OB; Future  3. Post-menopausal bleeding Postmenopausal bleeding x1 day in August 2020.  No recurrence since then.  Normal gynecologic exam today.  Follow-up for a pelvic ultrasound to evaluate the endometrial cavity. - US Transvaginal Non-OB; Future  4. Pelvic pain in female H/O Diverticulosis.  Refer to [facility].  No Colono x 15 yrs. - US Transvaginal Non-OB; Future  5. Class 1 obesity due to excess calories without serious comorbidity with body mass index (BMI) of 33.0 to 33.9 in adult Recommend a lower calorie/carb diet such as [employer].  Aerobic physical activities 5 times a week and weightlifting every 2 days.  Sheila [name], it was a pleasure seeing you today!  I will inform you of your results as soon as they are available.

## 2019-10-14 ENCOUNTER — Other Ambulatory Visit: Payer: Self-pay | Admitting: *Deleted

## 2019-10-14 DIAGNOSIS — R1032 Left lower quadrant pain: Secondary | ICD-10-CM

## 2019-10-14 NOTE — Progress Notes (Signed)
Pt will be contacted by [facility] to schedule apt. KW CMA  10/22/19 Pt has been left a message by  GI to c/b schedule apt. Sheila [name]

## 2019-11-10 ENCOUNTER — Encounter: Payer: Self-pay | Admitting: Obstetrics & Gynecology

## 2019-11-10 ENCOUNTER — Ambulatory Visit (INDEPENDENT_AMBULATORY_CARE_PROVIDER_SITE_OTHER): Payer: Self-pay

## 2019-11-10 ENCOUNTER — Other Ambulatory Visit: Payer: Self-pay

## 2019-11-10 ENCOUNTER — Ambulatory Visit (INDEPENDENT_AMBULATORY_CARE_PROVIDER_SITE_OTHER): Payer: Self-pay | Admitting: Obstetrics & Gynecology

## 2019-11-10 DIAGNOSIS — Z780 Asymptomatic menopausal state: Secondary | ICD-10-CM

## 2019-11-10 DIAGNOSIS — N950 Postmenopausal bleeding: Secondary | ICD-10-CM

## 2019-11-10 DIAGNOSIS — D219 Benign neoplasm of connective and other soft tissue, unspecified: Secondary | ICD-10-CM

## 2019-11-10 NOTE — Patient Instructions (Signed)
  1. Post-menopausal bleeding History of postmenopausal spotting in August 2020, no recurrence since then.  Pelvic ultrasound findings reviewed thoroughly with patient.  Patient reassured that the endometrial lining was thin and normal at 2.8 mm.  The overall uterine size was decreased as well as the size of the uterine fibroids since last ultrasound in January 2019.  Both ovaries were normal in size and appearance.  Will observe.  2. Fibroids Decreased in size since ultrasound in January 2019.  Sheila [name] un placer verle hoy!

## 2019-11-10 NOTE — Progress Notes (Signed)
    Sheila [name] 08-10-1961 [id number]        58 y.o.  G5P0023   RP: PMB in 07/2019 for Pelvic US  HPI: Postmenopause, on no HRT.  No recurrence of PMB since 07/2019, had mild spotting x 1 at that time.  No pelvic pain.  No vaginal discharge.   OB History  Gravida Para Term Preterm AB Living  5 3 0 0 2 3  SAB TAB Ectopic Multiple Live Births  2 0 0        # Outcome Date GA Lbr Len/2nd Weight Sex Delivery Anes PTL Lv  5 SAB           4 SAB           3 Para           2 Para           1 Para             Past medical history,surgical history, problem list, medications, allergies, family history and social history were all reviewed and documented in the EPIC chart.   Directed ROS with pertinent positives and negatives documented in the history of present illness/assessment and plan.  Exam:  There were no vitals filed for this visit. General appearance:  Normal  Pelvic US today: T/V images.  Comparison is made with previous scan December 10, 2017.  Anteverted uterus with size of the uterus and fibroids decreased since previous scan.  Uterus is measured at 8.01 x 5.51 x 4.53 cm.  The fibroids are measured at 4.23 x 3.58, 2.52 x 2.68, and 2 other fibroids less than 1 cm each.  The endometrial lining is distorted by fibroids but seen at 2.8 mm with no obvious mass or thickening seen.  Both ovaries are small with atrophic appearance.  No adnexal mass.  No free fluid in the posterior cul-de-sac.  All labs 10/2019 reviewed with patient.   Assessment/Plan:  58 y.o. G5P0023   1. Post-menopausal bleeding History of postmenopausal spotting in August 2020, no recurrence since then.  Pelvic ultrasound findings reviewed thoroughly with patient.  Patient reassured that the endometrial lining was thin and normal at 2.8 mm.  The overall uterine size was decreased as well as the size of the uterine fibroids since last ultrasound in January 2019.  Both ovaries were normal in size and  appearance.  Will observe.  2. Fibroids Decreased in size since ultrasound in January 2019.  Counseling on above issues and coordination of care more than 50% for 15 minutes.  Princess Bruins MD, 10:45 AM 11/10/2019

## 2019-11-21 ENCOUNTER — Encounter: Payer: Self-pay | Admitting: Obstetrics & Gynecology

## 2020-03-24 ENCOUNTER — Other Ambulatory Visit: Payer: Self-pay

## 2020-03-24 ENCOUNTER — Ambulatory Visit (INDEPENDENT_AMBULATORY_CARE_PROVIDER_SITE_OTHER): Payer: Self-pay | Admitting: Emergency Medicine

## 2020-03-24 ENCOUNTER — Encounter: Payer: Self-pay | Admitting: Emergency Medicine

## 2020-03-24 VITALS — BP 134/82 | HR 82 | Temp 98.6°F | Ht 62.0 in | Wt 172.0 lb

## 2020-03-24 DIAGNOSIS — R058 Other specified cough: Secondary | ICD-10-CM

## 2020-03-24 DIAGNOSIS — Z7689 Persons encountering health services in other specified circumstances: Secondary | ICD-10-CM

## 2020-03-24 DIAGNOSIS — R1032 Left lower quadrant pain: Secondary | ICD-10-CM

## 2020-03-24 DIAGNOSIS — T464X5A Adverse effect of angiotensin-converting-enzyme inhibitors, initial encounter: Secondary | ICD-10-CM

## 2020-03-24 DIAGNOSIS — I10 Essential (primary) hypertension: Secondary | ICD-10-CM

## 2020-03-24 DIAGNOSIS — R05 Cough: Secondary | ICD-10-CM

## 2020-03-24 DIAGNOSIS — K5790 Diverticulosis of intestine, part unspecified, without perforation or abscess without bleeding: Secondary | ICD-10-CM

## 2020-03-24 MED ORDER — AMOXICILLIN-POT CLAVULANATE 875-125 MG PO TABS: 1.0000 | ORAL_TABLET | Freq: Two times a day (BID) | ORAL | 0 refills | Status: AC

## 2020-03-24 MED ORDER — AMLODIPINE BESYLATE 5 MG PO TABS: 5.0000 mg | ORAL_TABLET | Freq: Every day | ORAL | 3 refills | Status: DC

## 2020-03-24 NOTE — Patient Instructions (Addendum)
Pare de tomar Lisinopril. Empieza Amlodipine 5mg  todos los dias. Toma Augmentin para posible diverticulitis.    If you have lab work done today you will be contacted with your lab results within the next 2 weeks.  If you have not heard from [place] then please contact us. The fastest way to get your results is to register for My Chart.   IF you received an x-ray today, you will receive an invoice from [facility]. Please contact [facility] at [phone] with questions or concerns regarding your invoice.   IF you received labwork today, you will receive an invoice from [place]. Please contact [facility] at [phone] with questions or concerns regarding your invoice.   Our billing staff will not be able to assist you with questions regarding bills from these companies.  You will be contacted with the lab results as soon as they are available. The fastest way to get your results is to activate your My Chart account. Instructions are located on the last page of this paperwork. If you have not heard from [place] regarding the results in 2 weeks, please contact this office.      Diverticulitis Diverticulitis  La diverticulitis ocurre cuando pequeos bolsillos que se han formado en el intestino grueso (colon) se infectan o se inflaman. Esto produce dolor de Paramedic y heces lquidas (diarrea). Estas bolsas en el colon se denominan divertculos. Se forman en las personas que tienen una afeccin llamada diverticulitis. Siga estas indicaciones en su casa: Medicamentos  [employer] de venta libre y los recetados solamente como se lo haya indicado el mdico. Estos incluyen los siguientes: ? Antibiticos. ? Analgsicos. ? Pastillas de [place]. ? Probiticos. ? Laxantes.  No conduzca ni use maquinaria pesada mientras toma analgsicos recetados.  Si le recetaron un antibitico, tmelo como se lo hayan indicado. No deje de tomarlos aunque se sienta mejor. Instrucciones  generales   Siga la dieta como se lo haya indicado el mdico.  Cuando se sienta mejor, el mdico puede indicarle que cambie la dieta. Tal vez necesite ingerir gran cantidad de fibra. La fibra facilita la evacuacin intestinal (defecacin). Entre los alimentos saludables con [place], se incluyen los siguientes: ? Frutos rojos. ? Frijoles. ? Lentejas. ? Verduras de [employer].  Haga ejercicios 3 o ms veces por semana. Hgalos durante 30 minutos cada vez. Ejerctese lo suficiente como para transpirar y Conservation officer, historic buildings los latidos cardacos.  Concurra a todas las visitas de control como se lo hayan indicado. Esto es importante. Puede que tenga que someterse a un examen del intestino grueso. Esto se denomina colonoscopia. Comunquese con un mdico si:  El dolor no mejora.  Le cuesta mucho comer o beber.  No defeca como lo hace normalmente. Solicite ayuda de inmediato si:  El Holiday representative.  Los problemas no mejoran.  Los problemas empeoran muy rpidamente.  Tiene fiebre.  Devuelve (vomita) ms de una vez.  Sus heces tienen las siguientes caractersticas: ? Teacher, English as a foreign language. ? Son de color negro. ? Son alquitranadas. Resumen  La diverticulitis ocurre cuando pequeos bolsillos que se han formado en el intestino grueso (colon) se infectan o se inflaman.  Tome los medicamentos solamente como se lo haya indicado el mdico.  Siga la dieta como se lo haya indicado el mdico. Esta informacin no tiene Marine scientist el consejo del mdico. Asegrese de hacerle al mdico cualquier pregunta que tenga. Document Revised: 05/24/2017 Document Reviewed: 05/24/2017 Elsevier Patient Education  [place].

## 2020-03-24 NOTE — Progress Notes (Signed)
Sheila [name] 59 y.o.   Chief Complaint  Patient presents with   Establish Care   left side pain    3 days     HISTORY OF PRESENT ILLNESS: This is a 59 y.o. female here to establish care with me.  First visit to this office. Has history of diverticulosis.  Complaining of left lower abdominal pain for the past 3 days.  Able to eat and drink.  Denies nausea or vomiting.  Denies fever or chills.  Denies rectal bleeding. Also has a history of hypertension.  On lisinopril but medication making her cough.  May have a history of GERD.  Not sure. No other complaints or medical concerns today.  HPI   Prior to Admission medications   Medication Sig Start Date End Date Taking? Authorizing Provider  lisinopril-hydrochlorothiazide (ZESTORETIC) 20-12.5 MG tablet TAKE 1 TABLET BY MOUTH ONCE DAILY 07/28/18  Yes [provider]    Allergies  Allergen Reactions   Naproxen Other (See Comments)   Acetaminophen Other (See Comments)    dizziness   Ace Inhibitors Cough    Patient Active Problem List   Diagnosis Date Noted   History of uterine fibroid 05/29/2017   Menopause 05/29/2017   Diverticulosis 02/18/2016   HLD (hyperlipidemia) 06/25/2015   Prediabetes 06/25/2015   Hypertension 04/17/2012    Past Medical History:  Diagnosis Date   Diverticulitis    Fibroids    High cholesterol    Hyperlipidemia    Hypertension     Past Surgical History:  Procedure Laterality Date   HERNIA REPAIR     TUBAL LIGATION      Social History   Socioeconomic History   Marital status: Married    Spouse name: Not on file   Number of children: Not on file   Years of education: Not on file   Highest education level: Not on file  Occupational History   Not on file  Tobacco Use   Smoking status: Never Smoker   Smokeless tobacco: Never Used  Substance and Sexual Activity   Alcohol use: No   Drug use: No   Sexual activity: Yes    Partners: Male      Birth control/protection: Surgical    Comment: 1st intercourse- 59, partners- 2, married- 52 yrs   Other Topics Concern   Not on file  Social History Narrative   ** Merged History Encounter **       ** Merged History Encounter **       Social Determinants of Radio broadcast assistant Strain:    Difficulty of Paying Living Expenses:   Food Insecurity:    Worried About Charity fundraiser in the Last Year:    Arboriculturist in the Last Year:   Transportation Needs:    Film/video editor (Medical):    Lack of Transportation (Non-Medical):   Physical Activity:    Days of Exercise per Week:    Minutes of Exercise per Session:   Stress:    Feeling of Stress :   Social Connections:    Frequency of Communication with Friends and Family:    Frequency of Social Gatherings with Friends and Family:    Attends Religious Services:    Active Member of Clubs or Organizations:    Attends Archivist Meetings:    Marital Status:   Intimate Partner Violence:    Fear of Current or Ex-Partner:    Emotionally Abused:    Physically Abused:  Sexually Abused:     Family History  Problem Relation Age of Onset   Cancer Mother        stomach   Pneumonia Father    Diabetes Sister    Diabetes Brother    Hypertension Sister    Hyperlipidemia Sister    Hypertension Sister      Review of Systems  Constitutional: Negative.  Negative for chills and fever.  HENT: Negative.  Negative for congestion and sore throat.   Respiratory: Positive for cough. Negative for hemoptysis, sputum production, shortness of breath and wheezing.   Cardiovascular: Negative.  Negative for chest pain and palpitations.  Gastrointestinal: Positive for abdominal pain. Negative for blood in stool, constipation, diarrhea, melena, nausea and vomiting.  Genitourinary: Negative.  Negative for dysuria and hematuria.  Musculoskeletal: Negative.  Negative for back pain, myalgias  and neck pain.  Skin: Negative.  Negative for rash.  Neurological: Negative.  Negative for dizziness and headaches.  Endo/Heme/Allergies: Negative.   All other systems reviewed and are negative.  Vitals:   03/24/20 1321  BP: 134/82  Pulse: 82  Temp: 98.6 F (37 C)  SpO2: 98%     Physical Exam Vitals reviewed.  Constitutional:      Appearance: Normal appearance.  HENT:     Head: Normocephalic.     Mouth/Throat:     Mouth: Mucous membranes are moist.     Pharynx: Oropharynx is clear.  Eyes:     Extraocular Movements: Extraocular movements intact.     Conjunctiva/sclera: Conjunctivae normal.     Pupils: Pupils are equal, round, and reactive to light.  Cardiovascular:     Rate and Rhythm: Normal rate and regular rhythm.     Pulses: Normal pulses.     Heart sounds: Normal heart sounds.  Pulmonary:     Effort: Pulmonary effort is normal.     Breath sounds: Normal breath sounds.  Abdominal:     General: Bowel sounds are normal. There is no distension.     Palpations: Abdomen is soft. There is no mass.     Tenderness: There is abdominal tenderness (Left lower quadrant). There is no guarding or rebound.  Musculoskeletal:        General: Normal range of motion.     Cervical back: Normal range of motion and neck supple.  Skin:    General: Skin is warm and dry.     Capillary Refill: Capillary refill takes less than 2 seconds.  Neurological:     General: No focal deficit present.     Mental Status: She is alert and oriented to person, place, and time.  Psychiatric:        Mood and Affect: Mood normal.      ASSESSMENT & PLAN: Sheila [name] was seen today for establish care and left side pain.  Diagnoses and all orders for this visit:  Left lower quadrant abdominal pain Comments: Suspected early diverticulitis Orders: -     amoxicillin-clavulanate (AUGMENTIN) 875-125 MG tablet; Take 1 tablet by mouth 2 (two) times daily for 7 days.  Diverticulosis  Essential  hypertension -     amLODipine (NORVASC) 5 MG tablet; Take 1 tablet (5 mg total) by mouth daily.  ACE-inhibitor cough  Encounter to establish care    Patient Instructions   Tyler Aas de tomar Lisinopril. Empieza Amlodipine 5mg  todos los dias. Toma Augmentin para posible diverticulitis.    If you have lab work done today you will be contacted with your lab results within the next 2  weeks.  If you have not heard from [place] then please contact us. The fastest way to get your results is to register for My Chart.   IF you received an x-ray today, you will receive an invoice from [facility]. Please contact [facility] at [phone] with questions or concerns regarding your invoice.   IF you received labwork today, you will receive an invoice from [place]. Please contact [facility] at [phone] with questions or concerns regarding your invoice.   Our billing staff will not be able to assist you with questions regarding bills from these companies.  You will be contacted with the lab results as soon as they are available. The fastest way to get your results is to activate your My Chart account. Instructions are located on the last page of this paperwork. If you have not heard from [place] regarding the results in 2 weeks, please contact this office.      Diverticulitis Diverticulitis  La diverticulitis ocurre cuando pequeos bolsillos que se han formado en el intestino grueso (colon) se infectan o se inflaman. Esto produce dolor de Paramedic y heces lquidas (diarrea). Estas bolsas en el colon se denominan divertculos. Se forman en las personas que tienen una afeccin llamada diverticulitis. Siga estas indicaciones en su casa: Medicamentos  [employer] de venta libre y los recetados solamente como se lo haya indicado el mdico. Estos incluyen los siguientes: ? Antibiticos. ? Analgsicos. ? Pastillas de [place]. ? Probiticos. ? Laxantes.  No conduzca ni use  maquinaria pesada mientras toma analgsicos recetados.  Si le recetaron un antibitico, tmelo como se lo hayan indicado. No deje de tomarlos aunque se sienta mejor. Instrucciones generales   Siga la dieta como se lo haya indicado el mdico.  Cuando se sienta mejor, el mdico puede indicarle que cambie la dieta. Tal vez necesite ingerir gran cantidad de fibra. La fibra facilita la evacuacin intestinal (defecacin). Entre los alimentos saludables con [place], se incluyen los siguientes: ? Frutos rojos. ? Frijoles. ? Lentejas. ? Verduras de [employer].  Haga ejercicios 3 o ms veces por semana. Hgalos durante 30 minutos cada vez. Ejerctese lo suficiente como para transpirar y Conservation officer, historic buildings los latidos cardacos.  Concurra a todas las visitas de control como se lo hayan indicado. Esto es importante. Puede que tenga que someterse a un examen del intestino grueso. Esto se denomina colonoscopia. Comunquese con un mdico si:  El dolor no mejora.  Le cuesta mucho comer o beber.  No defeca como lo hace normalmente. Solicite ayuda de inmediato si:  El Holiday representative.  Los problemas no mejoran.  Los problemas empeoran muy rpidamente.  Tiene fiebre.  Devuelve (vomita) ms de una vez.  Sus heces tienen las siguientes caractersticas: ? Teacher, English as a foreign language. ? Son de color negro. ? Son alquitranadas. Resumen  La diverticulitis ocurre cuando pequeos bolsillos que se han formado en el intestino grueso (colon) se infectan o se inflaman.  Tome los medicamentos solamente como se lo haya indicado el mdico.  Siga la dieta como se lo haya indicado el mdico. Esta informacin no tiene Marine scientist el consejo del mdico. Asegrese de hacerle al mdico cualquier pregunta que tenga. Document Revised: 05/24/2017 Document Reviewed: 05/24/2017 Elsevier Patient Education  2020 Elsevier Inc.      Agustina Caroli, MD [facility]

## 2020-06-16 ENCOUNTER — Ambulatory Visit (INDEPENDENT_AMBULATORY_CARE_PROVIDER_SITE_OTHER): Payer: Self-pay | Admitting: Emergency Medicine

## 2020-06-16 ENCOUNTER — Other Ambulatory Visit: Payer: Self-pay

## 2020-06-16 ENCOUNTER — Encounter: Payer: Self-pay | Admitting: Emergency Medicine

## 2020-06-16 VITALS — BP 133/84 | HR 85 | Temp 98.3°F | Ht 62.0 in | Wt 176.0 lb

## 2020-06-16 DIAGNOSIS — R109 Unspecified abdominal pain: Secondary | ICD-10-CM

## 2020-06-16 DIAGNOSIS — Z1231 Encounter for screening mammogram for malignant neoplasm of breast: Secondary | ICD-10-CM

## 2020-06-16 DIAGNOSIS — Z8719 Personal history of other diseases of the digestive system: Secondary | ICD-10-CM

## 2020-06-16 DIAGNOSIS — K5792 Diverticulitis of intestine, part unspecified, without perforation or abscess without bleeding: Secondary | ICD-10-CM

## 2020-06-16 MED ORDER — METRONIDAZOLE 500 MG PO TABS: 500.0000 mg | ORAL_TABLET | Freq: Two times a day (BID) | ORAL | 0 refills | Status: AC

## 2020-06-16 MED ORDER — CIPROFLOXACIN HCL 500 MG PO TABS: 500.0000 mg | ORAL_TABLET | Freq: Two times a day (BID) | ORAL | 0 refills | Status: AC

## 2020-06-16 NOTE — Patient Instructions (Addendum)
     If you have lab work done today you will be contacted with your lab results within the next 2 weeks.  If you have not heard from [place] then please contact us. The fastest way to get your results is to register for My Chart.   IF you received an x-ray today, you will receive an invoice from [facility]. Please contact [facility] at [phone] with questions or concerns regarding your invoice.   IF you received labwork today, you will receive an invoice from [place]. Please contact [facility] at [phone] with questions or concerns regarding your invoice.   Our billing staff will not be able to assist you with questions regarding bills from these companies.  You will be contacted with the lab results as soon as they are available. The fastest way to get your results is to activate your My Chart account. Instructions are located on the last page of this paperwork. If you have not heard from [place] regarding the results in 2 weeks, please contact this office.      Diverticulitis Diverticulitis  La diverticulitis ocurre cuando pequeos bolsillos que se han formado en el intestino grueso (colon) se infectan o se inflaman. Esto produce dolor de Paramedic y heces lquidas (diarrea). Estas bolsas en el colon se denominan divertculos. Se forman en las personas que tienen una afeccin llamada diverticulitis. Siga estas indicaciones en su casa: Medicamentos  [employer] de venta libre y los recetados solamente como se lo haya indicado el mdico. Estos incluyen los siguientes: ? Antibiticos. ? Analgsicos. ? Pastillas de [place]. ? Probiticos. ? Laxantes.  No conduzca ni use maquinaria pesada mientras toma analgsicos recetados.  Si le recetaron un antibitico, tmelo como se lo hayan indicado. No deje de tomarlos aunque se sienta mejor. Instrucciones generales   Siga la dieta como se lo haya indicado el mdico.  Cuando se sienta mejor, el mdico puede  indicarle que cambie la dieta. Tal vez necesite ingerir gran cantidad de fibra. La fibra facilita la evacuacin intestinal (defecacin). Entre los alimentos saludables con [place], se incluyen los siguientes: ? Frutos rojos. ? Frijoles. ? Lentejas. ? Verduras de [employer].  Haga ejercicios 3 o ms veces por semana. Hgalos durante 30 minutos cada vez. Ejerctese lo suficiente como para transpirar y Conservation officer, historic buildings los latidos cardacos.  Concurra a todas las visitas de control como se lo hayan indicado. Esto es importante. Puede que tenga que someterse a un examen del intestino grueso. Esto se denomina colonoscopia. Comunquese con un mdico si:  El dolor no mejora.  Le cuesta mucho comer o beber.  No defeca como lo hace normalmente. Solicite ayuda de inmediato si:  El [place] representative.  Los problemas no mejoran.  Los problemas empeoran muy rpidamente.  Tiene fiebre.  Devuelve (vomita) ms de una vez.  Sus heces tienen las siguientes caractersticas: ? Teacher, English as a foreign language. ? Son de color negro. ? Son alquitranadas. Resumen  La diverticulitis ocurre cuando pequeos bolsillos que se han formado en el intestino grueso (colon) se infectan o se inflaman.  Tome los medicamentos solamente como se lo haya indicado el mdico.  Siga la dieta como se lo haya indicado el mdico. Esta informacin no tiene Marine scientist el consejo del mdico. Asegrese de hacerle al mdico cualquier pregunta que tenga. Document Revised: 05/24/2017 Document Reviewed: 05/24/2017 Elsevier Patient Education  [place].

## 2020-06-16 NOTE — Progress Notes (Signed)
Sheila [name] 59 y.o.   Chief Complaint  Patient presents with  . Left ABD pain    x 2 days - hx of diverticulosis / spicy foods - indigestion / constipation   . discuss colonoscopy/ cologuard    HISTORY OF PRESENT ILLNESS: This is a 59 y.o. female with history of diverticulosis complaining of left-sided lower abdominal pain for the past 2 days along with some constipation and changing her bowel habits.  Denies fever or chills.  Denies nausea or vomiting.  Still able to eat and drink.  No other associated symptoms.  Denies urinary symptoms.  HPI   Prior to Admission medications   Medication Sig Start Date End Date Taking? Authorizing Provider  amLODipine (NORVASC) 5 MG tablet Take 1 tablet (5 mg total) by mouth daily. 03/24/20  Yes Venkat Ankney, Ines Bloomer, MD  VITAMIN D, CHOLECALCIFEROL, PO Take by mouth.   Yes [provider]  vitamin E (VITAMIN E) 180 MG (400 UNITS) capsule Take 400 Units by mouth daily.   Yes [provider]  ciprofloxacin (CIPRO) 500 MG tablet Take 1 tablet (500 mg total) by mouth 2 (two) times daily for 7 days. 06/16/20 06/23/20  Horald Pollen, MD  metroNIDAZOLE (FLAGYL) 500 MG tablet Take 1 tablet (500 mg total) by mouth 2 (two) times daily for 7 days. 06/16/20 06/23/20  Horald Pollen, MD    Allergies  Allergen Reactions  . Naproxen Other (See Comments)  . Acetaminophen Other (See Comments)    dizziness  . Ace Inhibitors Cough    Patient Active Problem List   Diagnosis Date Noted  . History of uterine fibroid 05/29/2017  . Menopause 05/29/2017  . Diverticulosis 02/18/2016  . HLD (hyperlipidemia) 06/25/2015  . Prediabetes 06/25/2015  . Hypertension 04/17/2012    Past Medical History:  Diagnosis Date  . Diverticulitis   . Fibroids   . High cholesterol   . Hyperlipidemia   . Hypertension     Past Surgical History:  Procedure Laterality Date  . HERNIA REPAIR    . TUBAL LIGATION      Social History    Socioeconomic History  . Marital status: Married    Spouse name: Not on file  . Number of children: Not on file  . Years of education: Not on file  . Highest education level: Not on file  Occupational History  . Not on file  Tobacco Use  . Smoking status: Never Smoker  . Smokeless tobacco: Never Used  Vaping Use  . Vaping Use: Never used  Substance and Sexual Activity  . Alcohol use: No  . Drug use: No  . Sexual activity: Yes    Partners: Male    Birth control/protection: Surgical    Comment: 1st intercourse- 17, partners- 2, married- 35 yrs   Other Topics Concern  . Not on file  Social History Narrative   ** Merged History Encounter **       ** Merged History Encounter **       Social Determinants of Health   Financial Resource Strain:   . Difficulty of Paying Living Expenses:   Food Insecurity:   . Worried About Charity fundraiser in the Last Year:   . Arboriculturist in the Last Year:   Transportation Needs:   . Film/video editor (Medical):   Marland Kitchen Lack of Transportation (Non-Medical):   Physical Activity:   . Days of Exercise per Week:   . Minutes of Exercise per Session:  Stress:   . Feeling of Stress :   Social Connections:   . Frequency of Communication with Friends and Family:   . Frequency of Social Gatherings with Friends and Family:   . Attends Religious Services:   . Active Member of Clubs or Organizations:   . Attends Archivist Meetings:   Marland Kitchen Marital Status:   Intimate Partner Violence:   . Fear of Current or Ex-Partner:   . Emotionally Abused:   Marland Kitchen Physically Abused:   . Sexually Abused:     Family History  Problem Relation Age of Onset  . Cancer Mother        stomach  . Pneumonia Father   . Diabetes Sister   . Diabetes Brother   . Hypertension Sister   . Hyperlipidemia Sister   . Hypertension Sister      Review of Systems  Constitutional: Negative.   HENT: Negative.  Negative for congestion and sore throat.    Respiratory: Negative.  Negative for cough and shortness of breath.   Cardiovascular: Negative.  Negative for chest pain and palpitations.  Gastrointestinal: Positive for abdominal pain and constipation. Negative for blood in stool, diarrhea, melena, nausea and vomiting.  Genitourinary: Negative.  Negative for dysuria and hematuria.  Musculoskeletal: Negative.  Negative for back pain, myalgias and neck pain.  Skin: Negative.  Negative for rash.  Neurological: Negative for dizziness and headaches.  All other systems reviewed and are negative.  Today's Vitals   06/16/20 1140  BP: 133/84  Pulse: 85  Temp: 98.3 F (36.8 C)  SpO2: 96%  Weight: 176 lb (79.8 kg)  Height: 5\' 2"  (1.575 m)   Body mass index is 32.19 kg/m.   Physical Exam Vitals reviewed.  Constitutional:      Appearance: Normal appearance.  HENT:     Head: Normocephalic.  Eyes:     Extraocular Movements: Extraocular movements intact.     Conjunctiva/sclera: Conjunctivae normal.     Pupils: Pupils are equal, round, and reactive to light.  Cardiovascular:     Rate and Rhythm: Normal rate and regular rhythm.     Pulses: Normal pulses.     Heart sounds: Normal heart sounds.  Pulmonary:     Effort: Pulmonary effort is normal.     Breath sounds: Normal breath sounds.  Abdominal:     General: Bowel sounds are normal.     Palpations: Abdomen is soft.     Tenderness: There is abdominal tenderness in the left upper quadrant and left lower quadrant. There is no guarding or rebound.  Musculoskeletal:        General: Normal range of motion.     Cervical back: Normal range of motion.  Skin:    General: Skin is warm and dry.  Neurological:     General: No focal deficit present.     Mental Status: She is alert and oriented to person, place, and time.  Psychiatric:        Mood and Affect: Mood normal.        Behavior: Behavior normal.      ASSESSMENT & PLAN: Sheila [name] was seen today for left abd pain and discuss  colonoscopy/ cologuard.  Diagnoses and all orders for this visit:  Acute diverticulitis -     ciprofloxacin (CIPRO) 500 MG tablet; Take 1 tablet (500 mg total) by mouth 2 (two) times daily for 7 days. -     metroNIDAZOLE (FLAGYL) 500 MG tablet; Take 1 tablet (500 mg total) by mouth  2 (two) times daily for 7 days.  Left lateral abdominal pain  History of diverticulosis  Encounter for screening mammogram for malignant neoplasm of breast -     MM DIGITAL SCREENING BILATERAL; Future    Patient Instructions       If you have lab work done today you will be contacted with your lab results within the next 2 weeks.  If you have not heard from [place] then please contact us. The fastest way to get your results is to register for My Chart.   IF you received an x-ray today, you will receive an invoice from [facility]. Please contact [facility] at [phone] with questions or concerns regarding your invoice.   IF you received labwork today, you will receive an invoice from [facility]. Please contact [facility] at [phone] with questions or concerns regarding your invoice.   Our billing staff will not be able to assist you with questions regarding bills from these companies.  You will be contacted with the lab results as soon as they are available. The fastest way to get your results is to activate your My Chart account. Instructions are located on the last page of this paperwork. If you have not heard from [place] regarding the results in 2 weeks, please contact this office.      Diverticulitis Diverticulitis  La diverticulitis ocurre cuando pequeos bolsillos que se han formado en el intestino grueso (colon) se infectan o se inflaman. Esto produce dolor de Paramedic y heces lquidas (diarrea). Estas bolsas en el colon se denominan divertculos. Se forman en las personas que tienen una afeccin llamada diverticulitis. Siga estas indicaciones en su casa: Medicamentos  [employer] de venta libre y los recetados solamente como se lo haya indicado el mdico. Estos incluyen los siguientes: ? Antibiticos. ? Analgsicos. ? Pastillas de [place]. ? Probiticos. ? Laxantes.  No conduzca ni use maquinaria pesada mientras toma analgsicos recetados.  Si le recetaron un antibitico, tmelo como se lo hayan indicado. No deje de tomarlos aunque se sienta mejor. Instrucciones generales   Siga la dieta como se lo haya indicado el mdico.  Cuando se sienta mejor, el mdico puede indicarle que cambie la dieta. Tal vez necesite ingerir gran cantidad de fibra. La fibra facilita la evacuacin intestinal (defecacin). Entre los alimentos saludables con [place], se incluyen los siguientes: ? Frutos rojos. ? Frijoles. ? Lentejas. ? Verduras de [employer].  Haga ejercicios 3 o ms veces por semana. Hgalos durante 30 minutos cada vez. Ejerctese lo suficiente como para transpirar y Conservation officer, historic buildings los latidos cardacos.  Concurra a todas las visitas de control como se lo hayan indicado. Esto es importante. Puede que tenga que someterse a un examen del intestino grueso. Esto se denomina colonoscopia. Comunquese con un mdico si:  El dolor no mejora.  Le cuesta mucho comer o beber.  No defeca como lo hace normalmente. Solicite ayuda de inmediato si:  El Holiday representative.  Los problemas no mejoran.  Los problemas empeoran muy rpidamente.  Tiene fiebre.  Devuelve (vomita) ms de una vez.  Sus heces tienen las siguientes caractersticas: ? Teacher, English as a foreign language. ? Son de color negro. ? Son alquitranadas. Resumen  La diverticulitis ocurre cuando pequeos bolsillos que se han formado en el intestino grueso (colon) se infectan o se inflaman.  Tome los medicamentos solamente como se lo haya indicado el mdico.  Siga la dieta como se lo haya indicado el mdico. Esta informacin no tiene Marine scientist el consejo del mdico.  Asegrese de hacerle al mdico cualquier  pregunta que tenga. Document Revised: 05/24/2017 Document Reviewed: 05/24/2017 Elsevier Patient Education  2020 Elsevier Inc.      Agustina Caroli, MD [facility]

## 2020-06-18 ENCOUNTER — Telehealth: Payer: Self-pay | Admitting: Emergency Medicine

## 2020-06-18 DIAGNOSIS — I10 Essential (primary) hypertension: Secondary | ICD-10-CM

## 2020-06-18 MED ORDER — AMLODIPINE BESYLATE 5 MG PO TABS: 5.0000 mg | ORAL_TABLET | Freq: Every day | ORAL | 1 refills | Status: DC

## 2020-06-18 NOTE — Telephone Encounter (Signed)
Returned call to patient , left VML for a return call.

## 2020-06-18 NOTE — Addendum Note (Signed)
Addended by: Anastasio Auerbach R on: 06/18/2020 02:13 PM   Modules accepted: Orders

## 2020-06-18 NOTE — Telephone Encounter (Signed)
Spoke w/ patient who stated yesterday she took Cipro and Flagyl back to back with food and felt like the room spun for about 25 seconds. She stated had forgotten to take amlodipine yesterday as well. Today she took her antibiotics more spaced apart w/no concerns . Pt advised to let us know if any other concerns. Amlodipine refill sent to pharmacy per pt request.

## 2020-06-18 NOTE — Telephone Encounter (Signed)
Patients daughter calling to report that Mom  took both her new medicine last night as prescribed and she started feeling really ,really dizzy   ciprofloxacin (CIPRO) 500 MG tablet  And metroNIDAZOLE (FLAGYL) 500 MG tablet    Please advise. Also,  patient is also asking for refill on   What is the name of the medication? amLODipine (NORVASC) 5 MG tablet     Have you contacted your pharmacy to request a refill? y  Which pharmacy would you like this sent [place], [place].  [address] [place] [address]  Phone:  [phone] Fax:  [phone]    Patient notified that their request is being sent to the clinical staff for review and that they should receive a call once it is complete. If they do not receive a call within 72 hours they can check with their pharmacy or our office.   pl

## 2020-06-30 ENCOUNTER — Encounter: Payer: Self-pay | Admitting: Gastroenterology

## 2020-08-31 ENCOUNTER — Other Ambulatory Visit (INDEPENDENT_AMBULATORY_CARE_PROVIDER_SITE_OTHER): Payer: Self-pay

## 2020-08-31 ENCOUNTER — Ambulatory Visit: Payer: Self-pay | Admitting: Gastroenterology

## 2020-08-31 ENCOUNTER — Encounter: Payer: Self-pay | Admitting: Gastroenterology

## 2020-08-31 VITALS — BP 120/72 | HR 98 | Ht 62.0 in | Wt 172.0 lb

## 2020-08-31 DIAGNOSIS — K5732 Diverticulitis of large intestine without perforation or abscess without bleeding: Secondary | ICD-10-CM

## 2020-08-31 DIAGNOSIS — R1032 Left lower quadrant pain: Secondary | ICD-10-CM

## 2020-08-31 LAB — BUN: BUN: 13 mg/dL (ref 6–23)

## 2020-08-31 LAB — CREATININE, SERUM: Creatinine, Ser: 0.67 mg/dL (ref 0.40–1.20)

## 2020-08-31 MED ORDER — DICYCLOMINE HCL 10 MG PO CAPS: 10.0000 mg | ORAL_CAPSULE | Freq: Three times a day (TID) | ORAL | 11 refills | Status: DC

## 2020-08-31 MED ORDER — NA SULFATE-K SULFATE-MG SULF 17.5-3.13-1.6 GM/177ML PO SOLN: 1.0000 | Freq: Once | ORAL | 0 refills | Status: AC

## 2020-08-31 NOTE — Progress Notes (Signed)
History of Present Illness: This is a 59 year old female referred by Horald Pollen, * MD for the evaluation of chronic left sided abdominal pain.  She is Spanish-speaking and is accompanied by a translator who provides almost all the translation.  She relates a long history of intermittent left-sided abdominal pain sometimes with bloating and constipation for 15 to 20 years.  She states she had a colonoscopy performed around 2005 for these symptoms and does not recall any abnormalities noted.  She has had a couple episodes acute left lower quadrant pain with a history of sigmoid diverticulitis in 2017 by CT AP. She had acute left lower quadrant pain in July and was treated with a course of Cipro and Flagyl for presumed diverticulitis with improvement in the acute more severe symptoms but persistent mild chronic symptoms. A CT AP was not performed in July. Denies weight loss, diarrhea, change in stool caliber, melena, hematochezia, nausea, vomiting, dysphagia, reflux symptoms, chest pain.    Allergies  Allergen Reactions  . Naproxen Other (See Comments)  . Acetaminophen Other (See Comments)    dizziness  . Ace Inhibitors Cough   Outpatient Medications Prior to Visit  Medication Sig Dispense Refill  . amLODipine (NORVASC) 5 MG tablet Take 1 tablet (5 mg total) by mouth daily. 90 tablet 1  . VITAMIN D, CHOLECALCIFEROL, PO Take 1 tablet by mouth as needed.     . vitamin E (VITAMIN E) 180 MG (400 UNITS) capsule Take 400 Units by mouth daily.     No facility-administered medications prior to visit.   Past Medical History:  Diagnosis Date  . Diverticulitis   . Fibroids   . High cholesterol   . Hyperlipidemia   . Hypertension    Past Surgical History:  Procedure Laterality Date  . HERNIA REPAIR    . TUBAL LIGATION     Social History   Socioeconomic History  . Marital status: Married    Spouse name: Not on file  . Number of children: Not on file  . Years of education: Not  on file  . Highest education level: Not on file  Occupational History  . Not on file  Tobacco Use  . Smoking status: Never Smoker  . Smokeless tobacco: Never Used  Vaping Use  . Vaping Use: Never used  Substance and Sexual Activity  . Alcohol use: No  . Drug use: No  . Sexual activity: Yes    Partners: Male    Birth control/protection: Surgical    Comment: 1st intercourse- 17, partners- 2, married- 54 yrs   Other Topics Concern  . Not on file  Social History Narrative   ** Merged History Encounter **       ** Merged History Encounter **       Social Determinants of Health   Financial Resource Strain:   . Difficulty of Paying Living Expenses: Not on file  Food Insecurity:   . Worried About Charity fundraiser in the Last Year: Not on file  . Ran Out of Food in the Last Year: Not on file  Transportation Needs:   . Lack of Transportation (Medical): Not on file  . Lack of Transportation (Non-Medical): Not on file  Physical Activity:   . Days of Exercise per Week: Not on file  . Minutes of Exercise per Session: Not on file  Stress:   . Feeling of Stress : Not on file  Social Connections:   . Frequency of Communication with  Friends and Family: Not on file  . Frequency of Social Gatherings with Friends and Family: Not on file  . Attends Religious Services: Not on file  . Active Member of Clubs or Organizations: Not on file  . Attends Archivist Meetings: Not on file  . Marital Status: Not on file   Family History  Problem Relation Age of Onset  . Stomach cancer Mother   . Pneumonia Father   . Diabetes Sister   . Diabetes Brother   . Hypertension Sister   . Hyperlipidemia Sister   . Hypertension Sister   . Liver disease Neg Hx   . Pancreatic cancer Neg Hx   . Esophageal cancer Neg Hx   . Colon cancer Neg Hx       Review of Systems: Pertinent positive and negative review of systems were noted in the above HPI section. All other review of systems were  otherwise negative.   Physical Exam: General: Well developed, well nourished, no acute distress Head: Normocephalic and atraumatic Eyes:  sclerae anicteric, EOMI Ears: Normal auditory acuity Mouth: Not examined, mask on during Covid-19 pandemic Neck: Supple, no masses or thyromegaly Lungs: Clear throughout to auscultation Heart: Regular rate and rhythm; no murmurs, rubs or bruits Abdomen: Soft, mild left sided tender and non distended. No masses, hepatosplenomegaly or hernias noted. Normal Bowel sounds Rectal: Deferred to colonoscopy Musculoskeletal: Symmetrical with no gross deformities  Skin: No lesions on visible extremities Pulses:  Normal pulses noted Extremities: No clubbing, cyanosis, edema or deformities noted Neurological: Alert oriented x 4, grossly nonfocal Cervical Nodes:  No significant cervical adenopathy Inguinal Nodes: No significant inguinal adenopathy Psychological:  Alert and cooperative. Normal mood and affect   Assessment and Recommendations:  1.  Chronic LLQ pain.  Episodes of acute diverticulitis.  Rule out smoldering diverticulitis, abscess, IBS, colorectal neoplasms.  Begin dicyclomine 10 mg p.o. 3 times daily AC.  Schedule CT of abdomen / pelvis.  Schedule colonoscopy. The risks (including bleeding, perforation, infection, missed lesions, medication reactions and possible hospitalization or surgery if complications occur), benefits, and alternatives to colonoscopy with possible biopsy and possible polypectomy were discussed with the patient and they consent to proceed.     cc: Horald Pollen, MD [place],  [place] [address]

## 2020-08-31 NOTE — Patient Instructions (Signed)
Your provider has requested that you go to the basement level for lab work before leaving today. Press "B" on the elevator. The lab is located at the first door on the left as you exit the elevator.   We have sent the following medications to your pharmacy for you to pick up at your convenience: dicyclomine.   You have been scheduled for a colonoscopy. Please follow written instructions given to you at your visit today.  Please pick up your prep supplies at the pharmacy within the next 1-3 days. If you use inhalers (even only as needed), please bring them with you on the day of your procedure.   You have been scheduled for a CT scan of the abdomen and pelvis at [facility] ([address]---this is in the same building as [employer]).   You are scheduled on 09/01/20 at 3:30pm. You should arrive 15 minutes prior to your appointment time for registration. Please follow the written instructions below on the day of your exam:  WARNING: IF YOU ARE ALLERGIC TO IODINE/X-RAY DYE, PLEASE NOTIFY RADIOLOGY IMMEDIATELY AT [phone]! YOU WILL BE GIVEN A 13 HOUR PREMEDICATION PREP.  1) Do not eat or drink anything after 11:30am (4 hours prior to your test) 2) You have been given 2 bottles of oral contrast to drink. The solution may taste better if refrigerated, but do NOT add ice or any other liquid to this solution. Shake well before drinking.    Drink 1 bottle of contrast @ 1:30pm (2 hours prior to your exam)  Drink 1 bottle of contrast @ 2:30pm (1 hour prior to your exam)  You may take any medications as prescribed with a small amount of water, if necessary. If you take any of the following medications: METFORMIN, GLUCOPHAGE, GLUCOVANCE, AVANDAMET, RIOMET, FORTAMET, Rebecca MET, JANUMET, GLUMETZA or METAGLIP, you MAY be asked to HOLD this medication 48 hours AFTER the exam.  The purpose of you drinking the oral contrast is to aid in the visualization of your intestinal  tract. The contrast solution may cause some diarrhea. Depending on your individual set of symptoms, you may also receive an intravenous injection of x-ray contrast/dye. Plan on being at [facility] for 30 minutes or longer, depending on the type of exam you are having performed.  This test typically takes 30-45 minutes to complete.  If you have any questions regarding your exam or if you need to reschedule, you may call the CT department at [phone] between the hours of 8:00 am and 5:00 pm, Monday-Friday.  ______________________________________________________  Due to recent changes in healthcare laws, you may see the results of your imaging and laboratory studies on MyChart before your provider has had a chance to review them.  We understand that in some cases there may be results that are confusing or concerning to you. Not all laboratory results come back in the same time frame and the provider may be waiting for multiple results in order to interpret others.  Please give [place] 48 hours in order for your provider to thoroughly review all the results before contacting the office for clarification of your results.    Thank you for choosing me and [facility].  Pricilla Riffle. Dagoberto Ligas., MD., [facility] Regal

## 2020-09-01 ENCOUNTER — Ambulatory Visit (INDEPENDENT_AMBULATORY_CARE_PROVIDER_SITE_OTHER)
Admission: RE | Admit: 2020-09-01 | Discharge: 2020-09-01 | Disposition: A | Discharge status: Home / Self Care | Payer: Self-pay | Source: Ambulatory Visit | Attending: Gastroenterology | Admitting: Gastroenterology

## 2020-09-01 ENCOUNTER — Other Ambulatory Visit: Payer: Self-pay

## 2020-09-01 DIAGNOSIS — K5732 Diverticulitis of large intestine without perforation or abscess without bleeding: Secondary | ICD-10-CM

## 2020-09-01 DIAGNOSIS — R1032 Left lower quadrant pain: Secondary | ICD-10-CM

## 2020-09-01 MED ORDER — IOHEXOL 300 MG/ML  SOLN
100.0000 mL | Freq: Once | INTRAMUSCULAR | Status: AC | PRN
  Administered 2020-09-01: 100 mL via INTRAVENOUS

## 2020-10-01 ENCOUNTER — Other Ambulatory Visit: Payer: Self-pay | Admitting: Gastroenterology

## 2020-10-01 LAB — SARS CORONAVIRUS 2 (TAT 6-24 HRS): SARS Coronavirus 2: NEGATIVE

## 2020-10-04 ENCOUNTER — Other Ambulatory Visit: Payer: Self-pay

## 2020-10-04 ENCOUNTER — Ambulatory Visit (AMBULATORY_SURGERY_CENTER): Payer: Self-pay | Admitting: Gastroenterology

## 2020-10-04 ENCOUNTER — Encounter: Payer: Self-pay | Admitting: Gastroenterology

## 2020-10-04 VITALS — BP 101/62 | HR 79 | Temp 97.5°F | Resp 14 | Ht 62.0 in | Wt 172.0 lb

## 2020-10-04 DIAGNOSIS — D127 Benign neoplasm of rectosigmoid junction: Secondary | ICD-10-CM

## 2020-10-04 DIAGNOSIS — Z1212 Encounter for screening for malignant neoplasm of rectum: Secondary | ICD-10-CM

## 2020-10-04 DIAGNOSIS — Z1211 Encounter for screening for malignant neoplasm of colon: Secondary | ICD-10-CM

## 2020-10-04 DIAGNOSIS — D125 Benign neoplasm of sigmoid colon: Secondary | ICD-10-CM

## 2020-10-04 DIAGNOSIS — R1032 Left lower quadrant pain: Secondary | ICD-10-CM

## 2020-10-04 DIAGNOSIS — D128 Benign neoplasm of rectum: Secondary | ICD-10-CM

## 2020-10-04 DIAGNOSIS — D124 Benign neoplasm of descending colon: Secondary | ICD-10-CM

## 2020-10-04 MED ORDER — SODIUM CHLORIDE 0.9 % IV SOLN: 500.0000 mL | Freq: Once | INTRAVENOUS | Status: DC

## 2020-10-04 NOTE — Progress Notes (Signed)
Called to room to assist during endoscopic procedure.  Patient ID and intended procedure confirmed with present staff. Received instructions for my participation in the procedure from the performing physician.  

## 2020-10-04 NOTE — Patient Instructions (Addendum)
Impression/Recommendations:  Polyp, diverticulosis, High fiber diet, and hemorrhoid handouts given to patient.  Continue present medications. Await pathology results. USTED TUVO UN PROCEDIMIENTO ENDOSCPICO HOY EN EL [facility]:   Lea el informe del procedimiento que se le entreg para cualquier pregunta especfica sobre lo que se Primary school teacher.  Si el informe del examen no responde a sus preguntas, por favor llame a su gastroenterlogo para aclararlo.  Si usted solicit que no se le den [employer] de lo que se Estate manager/land agent en su procedimiento al [employer] va a cuidar, entonces el informe del procedimiento se ha incluido en un sobre sellado para que usted lo revise despus cuando le sea ms conveniente.   LO QUE PUEDE ESPERAR: Algunas sensaciones de hinchazn en el abdomen.  Puede tener ms gases de lo normal.  El caminar puede ayudarle a eliminar el aire que se le puso en el tracto gastrointestinal durante el procedimiento y reducir la hinchazn.  Si le hicieron una endoscopia inferior (como una colonoscopia o una sigmoidoscopia flexible), podra notar manchas de sangre en las heces fecales o en el papel higinico.  Si se someti a una preparacin intestinal para su procedimiento, es posible que no tenga una evacuacin intestinal normal durante [employer].   Tenga en cuenta:  Es posible que note un poco de irritacin y congestin en la nariz o algn drenaje.  Esto es debido al oxgeno [employer] durante su procedimiento.  No hay que preocuparse y esto debe desaparecer ms o Scientist, research (medical).   SNTOMAS PARA REPORTAR INMEDIATAMENTE:  Despus de una endoscopia inferior (colonoscopia o sigmoidoscopia flexible):  Cantidades excesivas de sangre en las heces fecales  Sensibilidad significativa o empeoramiento de los dolores abdominales   Hinchazn aguda del abdomen que antes no tena   Fiebre de 100F o ms  Para asuntos urgentes o de Freight forwarder, puede comunicarse con un  gastroenterlogo a cualquier hora llamando al [phone].  DIETA:  Recomendamos una comida pequea al principio, pero luego puede continuar con su dieta normal.  Tome muchos lquidos, Teacher, adult education las bebidas alcohlicas durante 24 horas.    ACTIVIDAD:  Debe planear tomarse las cosas con calma por el resto del da y no debe CONDUCIR ni usar maquinaria pesada Programmer, applications (debido a los medicamentos de sedacin utilizados durante el examen).     SEGUIMIENTO: Nuestro personal llamar al nmero que aparece en su historial al siguiente da hbil de su procedimiento para ver cmo se siente y para responder cualquier pregunta o inquietud que pueda tener con respecto a la informacin que se le dio despus del procedimiento. Si no podemos contactarle, le dejaremos un mensaje.  Sin embargo, si se siente bien y no tiene Paediatric nurse, no es necesario que nos devuelva la llamada.  Asumiremos que ha regresado a sus actividades diarias normales sin incidentes. Si se le tomaron algunas biopsias, le contactaremos por telfono o por carta en las prximas 3 semanas.  Si no ha sabido [employer] biopsias en el transcurso de 3 semanas, por favor llmenos al [phone].   FIRMAS/CONFIDENCIALIDAD: Usted y/o el acompaante que le cuide han firmado documentos que se ingresarn en su historial mdico electrnico.  Estas firmas atestiguan el hecho de que la informacin anterior

## 2020-10-04 NOTE — Progress Notes (Signed)
Francetta Found -vs in adm  Interpreter with pt in [place]

## 2020-10-04 NOTE — Progress Notes (Signed)
Translator Mariel present in [place].

## 2020-10-04 NOTE — Op Note (Addendum)
[place] Patient Name: Sheila [name] Procedure Date: 10/04/2020 2:57 PM MRN: [id number] Endoscopist: Ladene Artist , MD Age: 59 Referring MD:  Date of Birth: Jan 07, 1961 Gender: Female Account #: [id number] Procedure:                Colonoscopy Indications:              Screening for colorectal malignant neoplasm Medicines:                Monitored Anesthesia Care Procedure:                Pre-Anesthesia Assessment:                           - Prior to the procedure, a History and Physical                            was performed, and patient medications and                            allergies were reviewed. The patient's tolerance of                            previous anesthesia was also reviewed. The risks                            and benefits of the procedure and the sedation                            options and risks were discussed with the patient.                            All questions were answered, and informed consent                            was obtained. Prior Anticoagulants: The patient has                            taken no previous anticoagulant or antiplatelet                            agents. ASA Grade Assessment: II - A patient with                            mild systemic disease. After reviewing the risks                            and benefits, the patient was deemed in                            satisfactory condition to undergo the procedure.                           After obtaining informed consent, the colonoscope  was passed under direct vision. Throughout the                            procedure, the patient's blood pressure, pulse, and                            oxygen saturations were monitored continuously. The                            Colonoscope was introduced through the anus and                            advanced to the the cecum, identified by                            appendiceal  orifice and ileocecal valve. The                            ileocecal valve, appendiceal orifice, and rectum                            were photographed. The quality of the bowel                            preparation was excellent. The colonoscopy was                            performed without difficulty. The patient tolerated                            the procedure well. Scope In: 3:04:05 PM Scope Out: 3:18:40 PM Scope Withdrawal Time: 0 hours 12 minutes 38 seconds  Total Procedure Duration: 0 hours 14 minutes 35 seconds  Findings:                 The perianal and digital rectal examinations were                            normal.                           Four sessile polyps were found in the rectum (1),                            sigmoid colon (1) and descending colon (2). The                            polyps were 5 to 7 mm in size. These polyps were                            removed with a cold snare. Resection and retrieval                            were complete.  Multiple small-mouthed diverticula were found in                            the left colon. There was narrowing of the colon in                            association with the diverticular opening. There                            was no evidence of diverticular bleeding.                           Internal hemorrhoids were found during                            retroflexion. The hemorrhoids were small and Grade                            I (internal hemorrhoids that do not prolapse).                           The exam was otherwise without abnormality on                            direct and retroflexion views. Complications:            No immediate complications. Estimated blood loss:                            None. Estimated Blood Loss:     Estimated blood loss: none. Impression:               - Four 5 to 7 mm polyps in the rectum, in the                            sigmoid colon and  in the descending colon, removed                            with a cold snare. Resected and retrieved.                           - Moderate diverticulosis in the left colon.                           - Internal hemorrhoids.                           - The examination was otherwise normal on direct                            and retroflexion views. Recommendation:           - Repeat colonoscopy date to be determined after                            pending pathology  results are reviewed for                            surveillance based on pathology results.                           - Patient has a contact number available for                            emergencies. The signs and symptoms of potential                            delayed complications were discussed with the                            patient. Return to normal activities tomorrow.                            Written discharge instructions were provided to the                            patient.                           - High fiber diet.                           - Continue present medications.                           - Await pathology results. Ladene Artist, MD 10/04/2020 3:23:46 PM This report has been signed electronically.

## 2020-10-06 ENCOUNTER — Telehealth: Payer: Self-pay

## 2020-10-06 NOTE — Telephone Encounter (Signed)
  Follow up Call-  Call back number 10/04/2020  Post procedure Call Back phone  # [phone]- ask to speak to [place] ( speaks English)  Permission to leave phone message Yes  Some recent data might be hidden     Patient questions:  Do you have a fever, pain , or abdominal swelling? No. Pain Score  0 *  Have you tolerated food without any problems? Yes.    Have you been able to return to your normal activities? Yes.    Do you have any questions about your discharge instructions: Diet   No. Medications  No. Follow up visit  No.  Do you have questions or concerns about your Care? No.  Actions: * If pain score is 4 or above: 1. No action needed, pain <4.Have you developed a fever since your procedure? no  2.   Have you had an respiratory symptoms (SOB or cough) since your procedure? no  3.   Have you tested positive for COVID 19 since your procedure no  4.   Have you had any family members/close contacts diagnosed with the COVID 19 since your procedure?  no   If yes to any of these questions please route to Joylene John, RN and Joella Prince, RN

## 2020-10-12 ENCOUNTER — Encounter: Payer: Self-pay | Admitting: Gastroenterology

## 2020-10-31 ENCOUNTER — Encounter (HOSPITAL_COMMUNITY): Payer: Self-pay | Admitting: *Deleted

## 2020-10-31 ENCOUNTER — Other Ambulatory Visit: Payer: Self-pay

## 2020-10-31 ENCOUNTER — Ambulatory Visit (HOSPITAL_COMMUNITY)
Admission: EM | Admit: 2020-10-31 | Discharge: 2020-10-31 | Disposition: A | Discharge status: Home / Self Care | Payer: Self-pay

## 2020-10-31 DIAGNOSIS — H1132 Conjunctival hemorrhage, left eye: Secondary | ICD-10-CM

## 2020-10-31 DIAGNOSIS — I10 Essential (primary) hypertension: Secondary | ICD-10-CM

## 2020-10-31 NOTE — ED Provider Notes (Signed)
[place]    CSN: [id number] Arrival date & time: 10/31/20  1324      History   Chief Complaint Chief Complaint  Patient presents with  . Eye Problem    HPI Sheila [name] is a 59 y.o. female.   HPI Patient presents today for evaluation of left eye redness upon awakening today. Patient has a medical history significant for uncontrolled hypertension. She is currently taking amlodipine 5 mg and last saw her primary care provider in July. She checks her blood pressure at home and readings have been consistently up and down averaging 035/465 systolically and diastolic between 80 and 90. She denies any pain, pressure, sensation of foreign body or inability to see. She denies any rubbing of her eye and reports waking up with the redness. Blood pressure on arrival is elevated 151/87 she took at home and it was 148 over 80s. She denies any headache or head pressure.  Past Medical History:  Diagnosis Date  . Diverticulitis   . Fibroids   . GERD (gastroesophageal reflux disease)   . High cholesterol   . Hyperlipidemia   . Hypertension     Patient Active Problem List   Diagnosis Date Noted  . History of uterine fibroid 05/29/2017  . Menopause 05/29/2017  . Diverticulosis 02/18/2016  . HLD (hyperlipidemia) 06/25/2015  . Prediabetes 06/25/2015  . Hypertension 04/17/2012    Past Surgical History:  Procedure Laterality Date  . HERNIA REPAIR    . TUBAL LIGATION      OB History    Gravida  5   Para  3   Term  0   Preterm  0   AB  2   Living  3     SAB  2   TAB  0   Ectopic  0   Multiple      Live Births               Home Medications    Prior to Admission medications   Medication Sig Start Date End Date Taking? Authorizing Provider  amLODipine (NORVASC) 5 MG tablet Take 1 tablet (5 mg total) by mouth daily. 06/18/20  Yes Sagardia, Ines Bloomer, MD  dicyclomine (BENTYL) 10 MG capsule Take 1 capsule (10 mg total) by mouth 3  (three) times daily before meals. Patient not taking: Reported on 10/04/2020 08/31/20   Ladene Artist, MD  vitamin C (ASCORBIC ACID) 250 MG tablet Take 250 mg by mouth in the morning and at bedtime.    [provider]  VITAMIN D, CHOLECALCIFEROL, PO Take 1 tablet by mouth as needed.     [provider]    Family History Family History  Problem Relation Age of Onset  . Stomach cancer Mother   . Pneumonia Father   . Diabetes Sister   . Diabetes Brother   . Hypertension Sister   . Hyperlipidemia Sister   . Hypertension Sister   . Liver disease Neg Hx   . Pancreatic cancer Neg Hx   . Esophageal cancer Neg Hx   . Colon cancer Neg Hx     Social History Social History   Tobacco Use  . Smoking status: Never Smoker  . Smokeless tobacco: Never Used  Vaping Use  . Vaping Use: Never used  Substance Use Topics  . Alcohol use: No  . Drug use: No     Allergies   Naproxen, Acetaminophen, and Ace inhibitors   Review of Systems Review of Systems Pertinent  negatives listed in HPI Physical Exam Triage Vital Signs ED Triage Vitals [10/31/20 1427]  Enc Vitals Group     BP (!) 151/87     Pulse Rate 80     Resp 16     Temp 98.5 F (36.9 C)     Temp Source Oral     SpO2 99 %     Weight      Height      Head Circumference      Peak Flow      Pain Score      Pain Loc      Pain Edu?      Excl. in [place]?    No data found.  Updated Vital Signs BP (!) 151/87 Comment: has not taken HTN med today  Pulse 80   Temp 98.5 F (36.9 C) (Oral)   Resp 16   LMP 05/08/2017 (Approximate)   SpO2 99%   Visual Acuity Right Eye Distance: 20/30 Left Eye Distance: 20/30 -1 Bilateral Distance: 20/25 -1  Right Eye Near:   Left Eye Near:    Bilateral Near:     Physical Exam Eyes:     General: Lids are normal. Lids are everted, no foreign bodies appreciated. Vision grossly intact. Gaze aligned appropriately.     Conjunctiva/sclera:     Left eye: Hemorrhage present.      Funduscopic exam:    Right eye: Hemorrhage present.        Left eye: Hemorrhage present.      Comments: Prominent vascularity on funduscopic exam and on left lateral eye   Cardiovascular:     Rate and Rhythm: Normal rate and regular rhythm.  Pulmonary:     Effort: Pulmonary effort is normal.     Breath sounds: Normal breath sounds and air entry.  Psychiatric:        Attention and Perception: Attention normal.        Mood and Affect: Mood normal.        Speech: Speech normal.        Thought Content: Thought content normal.        Cognition and Memory: Cognition normal.        Judgment: Judgment normal.     UC Treatments / Results  Labs (all labs ordered are listed, but only abnormal results are displayed) Labs Reviewed - No data to display  EKG   Radiology No results found.  Procedures Procedures (including critical care time)  Medications Ordered in UC Medications - No data to display  Initial Impression / Assessment and Plan / UC Course  I have reviewed the triage vital signs and the nursing notes.  Pertinent labs & imaging results that were available during my care of the patient were reviewed by me and considered in my medical decision making (see chart for details).    Eye exam concerning for possible hypertensive disease.  Patient is visual acuity is stable however she has prominent vessels in the left.  Given subconjunctival hemorrhage no treatment warranted however patient needs an evaluation to rule out hypertensive disease.  Information given to follow-up with [facility].  Patient is accompanied today by her husband who speaks [place] and has been assisting with translation during the visit.  Continue to monitor blood pressure at home added advised to follow-up with PCP to discuss possibly adjusting medication as blood pressure is not at goal.   Final Clinical Impressions(s) / UC Diagnoses   Final diagnoses:  Subconjunctival hemorrhage of left  eye    Discharge Instructions   None    ED Prescriptions    None     PDMP not reviewed this encounter.   Scot Jun, FNP 10/31/20 1625

## 2020-10-31 NOTE — ED Triage Notes (Signed)
Pt reports waking yesterday morning with redness to left eye.  Denies any known injury or pain.  Denies vision changes.  Does not wear contact lenses.

## 2020-11-03 ENCOUNTER — Encounter: Payer: Self-pay | Admitting: Emergency Medicine

## 2020-11-03 ENCOUNTER — Other Ambulatory Visit: Payer: Self-pay

## 2020-11-03 ENCOUNTER — Ambulatory Visit: Payer: Self-pay | Admitting: Emergency Medicine

## 2020-11-03 ENCOUNTER — Ambulatory Visit (INDEPENDENT_AMBULATORY_CARE_PROVIDER_SITE_OTHER): Payer: Self-pay | Admitting: Emergency Medicine

## 2020-11-03 VITALS — BP 135/78 | HR 91 | Temp 98.1°F | Ht 59.84 in | Wt 175.0 lb

## 2020-11-03 DIAGNOSIS — E785 Hyperlipidemia, unspecified: Secondary | ICD-10-CM

## 2020-11-03 DIAGNOSIS — H1132 Conjunctival hemorrhage, left eye: Secondary | ICD-10-CM

## 2020-11-03 DIAGNOSIS — Z6834 Body mass index (BMI) 34.0-34.9, adult: Secondary | ICD-10-CM

## 2020-11-03 DIAGNOSIS — I10 Essential (primary) hypertension: Secondary | ICD-10-CM

## 2020-11-03 MED ORDER — ATORVASTATIN CALCIUM 20 MG PO TABS: 20.0000 mg | ORAL_TABLET | Freq: Every day | ORAL | 3 refills | Status: DC

## 2020-11-03 NOTE — Assessment & Plan Note (Signed)
Persistently elevated systolic blood pressure.  Will increase amlodipine to 10 mg daily. Follow-up in 6 months.  Earlier as needed.

## 2020-11-03 NOTE — Progress Notes (Signed)
Sheila [name] 59 y.o.   Chief Complaint  Patient presents with  . Hypertension    f/u     HISTORY OF PRESENT ILLNESS: This is a 59 y.o. female with history of hypertension here for follow-up.  Presently taking amlodipine 5 mg daily. Recently woke up with "blood in my left eye".  Saw ophthalmologist today.  Diagnosed with subconjunctival hemorrhage. States systolic blood pressure has been elevated at home in the range of 145-155 with normal diastolics. No other complaints or medical concerns today.  HPI   Prior to Admission medications   Medication Sig Start Date End Date Taking? Authorizing Provider  amLODipine (NORVASC) 5 MG tablet Take 1 tablet (5 mg total) by mouth daily. 06/18/20  Yes Remy Voiles, Ines Bloomer, MD  dicyclomine (BENTYL) 10 MG capsule Take 1 capsule (10 mg total) by mouth 3 (three) times daily before meals. Patient not taking: Reported on 11/03/2020 08/31/20   Ladene Artist, MD  vitamin C (ASCORBIC ACID) 250 MG tablet Take 250 mg by mouth in the morning and at bedtime. Patient not taking: Reported on 11/03/2020    [provider]  VITAMIN D, CHOLECALCIFEROL, PO Take 1 tablet by mouth as needed.  Patient not taking: Reported on 11/03/2020    [provider]    Allergies  Allergen Reactions  . Naproxen Other (See Comments)  . Acetaminophen Other (See Comments)    dizziness  . Ace Inhibitors Cough    Patient Active Problem List   Diagnosis Date Noted  . History of uterine fibroid 05/29/2017  . Menopause 05/29/2017  . Diverticulosis 02/18/2016  . HLD (hyperlipidemia) 06/25/2015  . Prediabetes 06/25/2015  . Hypertension 04/17/2012    Past Medical History:  Diagnosis Date  . Diverticulitis   . Fibroids   . GERD (gastroesophageal reflux disease)   . High cholesterol   . Hyperlipidemia   . Hypertension     Past Surgical History:  Procedure Laterality Date  . HERNIA REPAIR    . TUBAL LIGATION      Social History    Socioeconomic History  . Marital status: Married    Spouse name: Not on file  . Number of children: Not on file  . Years of education: Not on file  . Highest education level: Not on file  Occupational History  . Not on file  Tobacco Use  . Smoking status: Never Smoker  . Smokeless tobacco: Never Used  Vaping Use  . Vaping Use: Never used  Substance and Sexual Activity  . Alcohol use: No  . Drug use: No  . Sexual activity: Yes    Partners: Male    Birth control/protection: Surgical    Comment: 1st intercourse- 17, partners- 2, married- 26 yrs   Other Topics Concern  . Not on file  Social History Narrative   ** Merged History Encounter **       ** Merged History Encounter **       Social Determinants of Health   Financial Resource Strain:   . Difficulty of Paying Living Expenses: Not on file  Food Insecurity:   . Worried About Charity fundraiser in the Last Year: Not on file  . Ran Out of Food in the Last Year: Not on file  Transportation Needs:   . Lack of Transportation (Medical): Not on file  . Lack of Transportation (Non-Medical): Not on file  Physical Activity:   . Days of Exercise per Week: Not on file  . Minutes of Exercise per  Session: Not on file  Stress:   . Feeling of Stress : Not on file  Social Connections:   . Frequency of Communication with Friends and Family: Not on file  . Frequency of Social Gatherings with Friends and Family: Not on file  . Attends Religious Services: Not on file  . Active Member of Clubs or Organizations: Not on file  . Attends Archivist Meetings: Not on file  . Marital Status: Not on file  Intimate Partner Violence:   . Fear of Current or Ex-Partner: Not on file  . Emotionally Abused: Not on file  . Physically Abused: Not on file  . Sexually Abused: Not on file    Family History  Problem Relation Age of Onset  . Stomach cancer Mother   . Pneumonia Father   . Diabetes Sister   . Diabetes Brother   .  Hypertension Sister   . Hyperlipidemia Sister   . Hypertension Sister   . Liver disease Neg Hx   . Pancreatic cancer Neg Hx   . Esophageal cancer Neg Hx   . Colon cancer Neg Hx      Review of Systems  Constitutional: Negative.  Negative for chills and fever.  HENT: Negative.  Negative for congestion and sore throat.   Eyes: Positive for redness. Negative for blurred vision, double vision, pain and discharge.  Respiratory: Negative.  Negative for cough and shortness of breath.   Cardiovascular: Negative.  Negative for chest pain and palpitations.  Gastrointestinal: Negative for abdominal pain, nausea and vomiting.  Genitourinary: Negative.   Musculoskeletal: Negative.   Skin: Negative.   Neurological: Negative for dizziness and headaches.  All other systems reviewed and are negative.    Physical Exam Vitals reviewed.  Constitutional:      Appearance: Normal appearance.  HENT:     Head: Normocephalic.  Eyes:     Extraocular Movements: Extraocular movements intact.     Pupils: Pupils are equal, round, and reactive to light.     Comments: Small left subconjunctival hemorrhage  Cardiovascular:     Rate and Rhythm: Normal rate and regular rhythm.     Pulses: Normal pulses.     Heart sounds: Normal heart sounds.  Pulmonary:     Effort: Pulmonary effort is normal.     Breath sounds: Normal breath sounds.  Musculoskeletal:        General: Normal range of motion.     Cervical back: Normal range of motion and neck supple.  Skin:    General: Skin is warm and dry.     Capillary Refill: Capillary refill takes less than 2 seconds.  Neurological:     General: No focal deficit present.     Mental Status: She is alert and oriented to person, place, and time.  Psychiatric:        Mood and Affect: Mood normal.        Behavior: Behavior normal.      ASSESSMENT & PLAN: Hypertension Persistently elevated systolic blood pressure.  Will increase amlodipine to 10 mg  daily. Follow-up in 6 months.  Earlier as needed.   [name] was seen today for hypertension.  Diagnoses and all orders for this visit:  Primary hypertension  Dyslipidemia -     atorvastatin (LIPITOR) 20 MG tablet; Take 1 tablet (20 mg total) by mouth daily.  Body mass index (BMI) of 34.0-34.9 in adult  Non-traumatic subconjunctival hemorrhage of left eye    Patient Instructions   Increase amlodipine to 10  mg daily. Aumenta la dosis de Amlodipina a 10 mg diario.    If you have lab work done today you will be contacted with your lab results within the next 2 weeks.  If you have not heard from [place] then please contact us. The fastest way to get your results is to register for My Chart.   IF you received an x-ray today, you will receive an invoice from [facility]. Please contact [facility] at [phone] with questions or concerns regarding your invoice.   IF you received labwork today, you will receive an invoice from [place]. Please contact [facility] at [phone] with questions or concerns regarding your invoice.   Our billing staff will not be able to assist you with questions regarding bills from these companies.  You will be contacted with the lab results as soon as they are available. The fastest way to get your results is to activate your My Chart account. Instructions are located on the last page of this paperwork. If you have not heard from [place] regarding the results in 2 weeks, please contact this office.     p Hipertensin en los adultos Hypertension, Adult El trmino hipertensin es otra forma de denominar a la presin arterial elevada. La presin arterial elevada fuerza al corazn a trabajar ms para bombear la sangre. Esto puede causar problemas con el paso del [place]. Una lectura de presin arterial est compuesta por 2 nmeros. Hay un nmero superior (sistlico) sobre un nmero inferior (diastlico). Lo ideal es tener la presin arterial por  debajo de 120/80. Las elecciones saludables pueden ayudar a Engineer, materials presin arterial, o tal vez necesite medicamentos para bajarla. Cules son las causas? Se desconoce la causa de esta afeccin. Algunas afecciones pueden estar relacionadas con la presin arterial alta. Qu incrementa el riesgo?  Fumar.  Tener diabetes mellitus tipo 2, colesterol alto, o ambos.  No hacer la cantidad suficiente de actividad fsica o ejercicio.  Tener sobrepeso.  Consumir mucha grasa, azcar, caloras o sal (sodio) en su dieta.  Beber alcohol en exceso.  Tener una enfermedad renal a largo plazo (crnica).  Tener antecedentes familiares de presin arterial alta.  Edad. Los riesgos aumentan con la edad.  Raza. El riesgo es mayor para las Retail banker.  Sexo. Antes de los 45aos, los hombres corren ms [employer]. Despus de los 65aos, las mujeres corren ms [employer].  Tener apnea obstructiva del sueo.  Estrs. Cules son los signos o los sntomas?  Es posible que la presin arterial alta puede no cause sntomas. La presin arterial muy alta (crisis hipertensiva) puede provocar: ? Dolor de [place]. ? Sensaciones de preocupacin o nerviosismo (ansiedad). ? Falta de aire. ? Hemorragia nasal. ? Sensacin de Engineer, site (nuseas). ? Vmitos. ? Cambios en la forma de ver. ? Dolor muy intenso en el pecho. ? Convulsiones. Cmo se trata?  Esta afeccin se trata haciendo cambios saludables en el estilo de vida, por ejemplo: ? Consumir alimentos saludables. ? Hacer ms ejercicio. ? Beber menos alcohol.  El mdico puede recetarle medicamentos si los cambios en el estilo de vida no son suficientes para Child psychotherapist la presin arterial y si: ? El nmero de arriba est por encima de 130. ? El nmero de abajo est por encima de 80.  Su presin arterial personal ideal puede variar. Siga estas instrucciones en su casa: Comida y  bebida   Si se lo dicen, siga el plan de alimentacin de DASH (  Dietary Approaches to Stop Hypertension, Maneras de alimentarse para detener la hipertensin). Para seguir este plan: ? Llene la mitad del plato de cada comida con frutas y verduras. ? Llene un cuarto del plato de cada comida con cereales integrales. Los cereales integrales incluyen pasta integral, arroz integral y pan integral. ? Coma y beba productos lcteos con bajo contenido de grasa, como leche descremada o yogur bajo en grasas. ? Llene un cuarto del plato de cada comida con protenas bajas en grasa (magras). Las protenas bajas en grasa incluyen pescado, pollo sin piel, huevos, frijoles y tofu. ? Evite consumir carne grasa, carne curada y procesada, o pollo con piel. ? Evite consumir alimentos prehechos o procesados.  Consuma menos de 1500 mg de sal por da.  No beba alcohol si: ? El mdico le indica que no lo haga. ? Est embarazada, puede estar embarazada o est tratando de quedar embarazada.  Si bebe alcohol: ? Limite la cantidad que bebe a lo siguiente:  De 0 a 1 medida por da para las mujeres.  De 0 a 2 medidas por da para los hombres. ? Est atento a la cantidad de alcohol que hay en las bebidas que toma. En los [place], una medida equivale a una botella de cerveza de 12oz (32ml), un vaso de vino de 5oz (16ml) o un vaso de una bebida alcohlica de alta graduacin de 1oz (41ml). Estilo de vida   Trabaje con su mdico para mantenerse en un peso saludable o para perder peso. Pregntele a su mdico cul es el peso recomendable para usted.  Haga al menos 17minutos de ejercicio la [employer] de la [place]. Estos pueden incluir caminar, nadar o andar en bicicleta.  Realice al menos 30 minutos de ejercicio que fortalezca sus msculos (ejercicios de resistencia) al menos 3 das a la [place]. Estos pueden incluir levantar pesas o hacer Pilates.  No consuma ningn producto que contenga nicotina  o tabaco, como cigarrillos, cigarrillos electrnicos y tabaco de Higher education careers adviser. Si necesita ayuda para dejar de fumar, consulte al [employer].  Controle su presin arterial en su casa tal como le indic el mdico.  Concurra a todas las visitas de seguimiento como se lo haya indicado el mdico. Esto es importante. Medicamentos  [employer] de venta libre y los recetados solamente como se lo haya indicado el mdico. Siga cuidadosamente las indicaciones.  No omita las dosis de medicamentos para la presin arterial. Los medicamentos pierden eficacia si omite dosis. El hecho de omitir las dosis tambin [place] el riesgo de otros problemas.  Pregntele a su mdico a qu efectos secundarios o reacciones a los Careers information officer. Comunquese con un mdico si:  Piensa que tiene [place] reaccin a los medicamentos que est tomando.  Tiene dolores de cabeza frecuentes (recurrentes).  Se siente mareado.  Tiene hinchazn en los tobillos.  Tiene problemas de visin. Solicite ayuda inmediatamente si:  Siente un dolor de cabeza muy intenso.  Empieza a sentirse desorientado (confundido).  Se siente dbil o adormecido.  Siente que va a desmayarse.  Tiene un dolor muy intenso en las siguientes zonas: ? Pecho. ? Vientre (abdomen).  Vomita ms de una vez.  Tiene dificultad para respirar. Resumen  El trmino hipertensin es otra forma de denominar a la presin arterial elevada.  La presin arterial elevada fuerza al corazn a trabajar ms para bombear la sangre.  Para la [employer], una presin arterial normal es menor que 120/80.  Las decisiones  saludables pueden ayudarle a disminuir su presin arterial. Si no puede bajar su presin arterial mediante decisiones saludables, es posible que deba tomar medicamentos. Esta informacin no tiene Marine scientist el consejo del mdico. Asegrese de hacerle al mdico cualquier pregunta que tenga. Document Revised:  09/05/2018 Document Reviewed: 09/05/2018 Elsevier Patient Education  2020 Elsevier Inc.      Agustina Caroli, MD [facility]

## 2020-11-03 NOTE — Patient Instructions (Addendum)
Increase amlodipine to 10 mg daily. Aumenta la dosis de Amlodipina a 10 mg diario.    If you have lab work done today you will be contacted with your lab results within the next 2 weeks.  If you have not heard from [place] then please contact us. The fastest way to get your results is to register for My Chart.   IF you received an x-ray today, you will receive an invoice from [facility]. Please contact [facility] at [phone] with questions or concerns regarding your invoice.   IF you received labwork today, you will receive an invoice from [place]. Please contact [facility] at [phone] with questions or concerns regarding your invoice.   Our billing staff will not be able to assist you with questions regarding bills from these companies.  You will be contacted with the lab results as soon as they are available. The fastest way to get your results is to activate your My Chart account. Instructions are located on the last page of this paperwork. If you have not heard from [place] regarding the results in 2 weeks, please contact this office.     p Hipertensin en los adultos Hypertension, Adult El trmino hipertensin es otra forma de denominar a la presin arterial elevada. La presin arterial elevada fuerza al corazn a trabajar ms para bombear la sangre. Esto puede causar problemas con el paso del [place]. Una lectura de presin arterial est compuesta por 2 nmeros. Hay un nmero superior (sistlico) sobre un nmero inferior (diastlico). Lo ideal es tener la presin arterial por debajo de 120/80. Las elecciones saludables pueden ayudar a Engineer, materials presin arterial, o tal vez necesite medicamentos para bajarla. Cules son las causas? Se desconoce la causa de esta afeccin. Algunas afecciones pueden estar relacionadas con la presin arterial alta. Qu incrementa el riesgo?  Fumar.  Tener diabetes mellitus tipo 2, colesterol alto, o ambos.  No hacer la cantidad  suficiente de actividad fsica o ejercicio.  Tener sobrepeso.  Consumir mucha grasa, azcar, caloras o sal (sodio) en su dieta.  Beber alcohol en exceso.  Tener una enfermedad renal a largo plazo (crnica).  Tener antecedentes familiares de presin arterial alta.  Edad. Los riesgos aumentan con la edad.  Raza. El riesgo es mayor para las Retail banker.  Sexo. Antes de los 45aos, los hombres corren ms [employer]. Despus de los 65aos, las mujeres corren ms [employer].  Tener apnea obstructiva del sueo.  Estrs. Cules son los signos o los sntomas?  Es posible que la presin arterial alta puede no cause sntomas. La presin arterial muy alta (crisis hipertensiva) puede provocar: ? Dolor de [place]. ? Sensaciones de preocupacin o nerviosismo (ansiedad). ? Falta de aire. ? Hemorragia nasal. ? Sensacin de Engineer, site (nuseas). ? Vmitos. ? Cambios en la forma de ver. ? Dolor muy intenso en el pecho. ? Convulsiones. Cmo se trata?  Esta afeccin se trata haciendo cambios saludables en el estilo de vida, por ejemplo: ? Consumir alimentos saludables. ? Hacer ms ejercicio. ? Beber menos alcohol.  El mdico puede recetarle medicamentos si los cambios en el estilo de vida no son suficientes para Child psychotherapist la presin arterial y si: ? El nmero de arriba est por encima de 130. ? El nmero de abajo est por encima de 80.  Su presin arterial personal ideal puede variar. Siga estas instrucciones en su casa: Comida y bebida   Si se lo dicen, siga el plan de  alimentacin de DASH (Dietary Approaches to Stop Hypertension, Maneras de alimentarse para detener la hipertensin). Para seguir este plan: ? Llene la mitad del plato de cada comida con frutas y verduras. ? Llene un cuarto del plato de cada comida con cereales integrales. Los cereales integrales incluyen pasta integral, arroz integral y pan integral. ? Coma y  beba productos lcteos con bajo contenido de grasa, como leche descremada o yogur bajo en grasas. ? Llene un cuarto del plato de cada comida con protenas bajas en grasa (magras). Las protenas bajas en grasa incluyen pescado, pollo sin piel, huevos, frijoles y tofu. ? Evite consumir carne grasa, carne curada y procesada, o pollo con piel. ? Evite consumir alimentos prehechos o procesados.  Consuma menos de 1500 mg de sal por da.  No beba alcohol si: ? El mdico le indica que no lo haga. ? Est embarazada, puede estar embarazada o est tratando de quedar embarazada.  Si bebe alcohol: ? Limite la cantidad que bebe a lo siguiente:  De 0 a 1 medida por da para las mujeres.  De 0 a 2 medidas por da para los hombres. ? Est atento a la cantidad de alcohol que hay en las bebidas que toma. En los [place], una medida equivale a una botella de cerveza de 12oz (341ml), un vaso de vino de 5oz (134ml) o un vaso de una bebida alcohlica de alta graduacin de 1oz (59ml). Estilo de vida   Trabaje con su mdico para mantenerse en un peso saludable o para perder peso. Pregntele a su mdico cul es el peso recomendable para usted.  Haga al menos 74minutos de ejercicio la [employer] de la [place]. Estos pueden incluir caminar, nadar o andar en bicicleta.  Realice al menos 30 minutos de ejercicio que fortalezca sus msculos (ejercicios de resistencia) al menos 3 das a la [place]. Estos pueden incluir levantar pesas o hacer Pilates.  No consuma ningn producto que contenga nicotina o tabaco, como cigarrillos, cigarrillos electrnicos y tabaco de Higher education careers adviser. Si necesita ayuda para dejar de fumar, consulte al [employer].  Controle su presin arterial en su casa tal como le indic el mdico.  Concurra a todas las visitas de seguimiento como se lo haya indicado el mdico. Esto es importante. Medicamentos  [employer] de venta libre y los recetados solamente como se lo haya  indicado el mdico. Siga cuidadosamente las indicaciones.  No omita las dosis de medicamentos para la presin arterial. Los medicamentos pierden eficacia si omite dosis. El hecho de omitir las dosis tambin [place] el riesgo de otros problemas.  Pregntele a su mdico a qu efectos secundarios o reacciones a los Careers information officer. Comunquese con un mdico si:  Piensa que tiene [place] reaccin a los medicamentos que est tomando.  Tiene dolores de cabeza frecuentes (recurrentes).  Se siente mareado.  Tiene hinchazn en los tobillos.  Tiene problemas de visin. Solicite ayuda inmediatamente si:  Siente un dolor de cabeza muy intenso.  Empieza a sentirse desorientado (confundido).  Se siente dbil o adormecido.  Siente que va a desmayarse.  Tiene un dolor muy intenso en las siguientes zonas: ? Pecho. ? Vientre (abdomen).  Vomita ms de una vez.  Tiene dificultad para respirar. Resumen  El trmino hipertensin es otra forma de denominar a la presin arterial elevada.  La presin arterial elevada fuerza al corazn a trabajar ms para bombear la sangre.  Para la [employer], una presin arterial normal es menor que  120/80.  Las decisiones saludables pueden ayudarle a disminuir su presin arterial. Si no puede bajar su presin arterial mediante decisiones saludables, es posible que deba tomar medicamentos. Esta informacin no tiene Marine scientist el consejo del mdico. Asegrese de hacerle al mdico cualquier pregunta que tenga. Document Revised: 09/05/2018 Document Reviewed: 09/05/2018 Elsevier Patient Education  [place].

## 2020-12-16 ENCOUNTER — Encounter: Payer: Self-pay | Admitting: Emergency Medicine

## 2020-12-16 ENCOUNTER — Telehealth (INDEPENDENT_AMBULATORY_CARE_PROVIDER_SITE_OTHER): Payer: Self-pay | Admitting: Emergency Medicine

## 2020-12-16 ENCOUNTER — Other Ambulatory Visit: Payer: Self-pay

## 2020-12-16 VITALS — Temp 100.0°F

## 2020-12-16 DIAGNOSIS — U071 COVID-19: Secondary | ICD-10-CM

## 2020-12-16 DIAGNOSIS — R6889 Other general symptoms and signs: Secondary | ICD-10-CM

## 2020-12-16 NOTE — Progress Notes (Signed)
Telemedicine Encounter- SOAP NOTE Established Patient Patient: Home  Provider: Office     This telephone encounter was conducted with the patient's (or proxy's) verbal consent via audio telecommunications: yes/no: Yes Patient was instructed to have this encounter in a suitably private space; and to only have persons present to whom they give permission to participate. In addition, patient identity was confirmed by use of name plus two identifiers (DOB and address).  I discussed the limitations, risks, security and privacy concerns of performing an evaluation and management service by telephone and the availability of in person appointments. I also discussed with the patient that there may be a patient responsible charge related to this service. The patient expressed understanding and agreed to proceed.  I spent a total of TIME; 0 MIN TO 60 MIN: 20 minutes talking with the patient or their proxy.  Chief Complaint  Patient presents with  . Sore Throat    X 3 days-  positive home covid test - states son is covid positive and pneumonia  . Cough    Mucinex and tylenol   . Generalized Body Aches    Chills   . Fever    100.0     Subjective   Sheila [name] is a 60 y.o. female established patient. Telephone visit today complaining of flulike symptoms that started 3 days ago.  Tested positive for COVID.  63 year old son also sick with COVID.  Mostly complaining of sore throat, low-grade fever, generalized aches, cough and today she developed right earache.  Denies difficulty breathing or chest pain.  Able to eat and drink.  Denies nausea or vomiting.  No other significant associated symptoms.  Has been taking Mucinex and Tylenol.  HPI   Patient Active Problem List   Diagnosis Date Noted  . History of uterine fibroid 05/29/2017  . Menopause 05/29/2017  . Diverticulosis 02/18/2016  . HLD (hyperlipidemia) 06/25/2015  . Prediabetes 06/25/2015  . Hypertension 04/17/2012     Past Medical History:  Diagnosis Date  . Diverticulitis   . Fibroids   . GERD (gastroesophageal reflux disease)   . High cholesterol   . Hyperlipidemia   . Hypertension     Current Outpatient Medications  Medication Sig Dispense Refill  . amLODipine (NORVASC) 5 MG tablet Take 1 tablet (5 mg total) by mouth daily. 90 tablet 1  . atorvastatin (LIPITOR) 20 MG tablet Take 1 tablet (20 mg total) by mouth daily. 90 tablet 3  . vitamin C (ASCORBIC ACID) 250 MG tablet Take 250 mg by mouth in the morning and at bedtime.    Marland Kitchen VITAMIN D, CHOLECALCIFEROL, PO Take 1 tablet by mouth as needed.    . vitamin E 200 UNIT capsule Take 200 Units by mouth daily.    . Zinc Sulfate (ZINC 15 PO) Take by mouth.    . dicyclomine (BENTYL) 10 MG capsule Take 1 capsule (10 mg total) by mouth 3 (three) times daily before meals. (Patient not taking: No sig reported) 90 capsule 11   No current facility-administered medications for this visit.    Allergies  Allergen Reactions  . Naproxen Other (See Comments)  . Acetaminophen Other (See Comments)    dizziness  . Ace Inhibitors Cough    Social History   Socioeconomic History  . Marital status: Married    Spouse name: Not on file  . Number of children: Not on file  . Years of education: Not on file  . Highest education level: Not on file  Occupational  History  . Not on file  Tobacco Use  . Smoking status: Never Smoker  . Smokeless tobacco: Never Used  Vaping Use  . Vaping Use: Never used  Substance and Sexual Activity  . Alcohol use: No  . Drug use: No  . Sexual activity: Yes    Partners: Male    Birth control/protection: Surgical    Comment: 1st intercourse- 17, partners- 2, married- 45 yrs   Other Topics Concern  . Not on file  Social History Narrative   ** Merged History Encounter **       ** Merged History Encounter **       Social Determinants of Health   Financial Resource Strain: Not on file  Food Insecurity: Not on file   Transportation Needs: Not on file  Physical Activity: Not on file  Stress: Not on file  Social Connections: Not on file  Intimate Partner Violence: Not on file    Review of Systems  Constitutional: Positive for chills, fever and malaise/fatigue.  HENT: Positive for ear pain and sore throat.   Respiratory: Positive for cough.   Cardiovascular: Negative.  Negative for chest pain and palpitations.  Gastrointestinal: Negative for abdominal pain, diarrhea, nausea and vomiting.  Genitourinary: Negative.  Negative for dysuria and hematuria.  Musculoskeletal: Positive for myalgias.  Neurological: Positive for headaches. Negative for dizziness.  All other systems reviewed and are negative.   Objective  Alert and oriented x3 in no apparent respiratory distress Vitals as reported by the patient: Today's Vitals   12/16/20 1631  Temp: 100 F (37.8 C)    Elmarie was seen today for sore throat, cough, generalized body aches and fever.  Diagnoses and all orders for this visit:  COVID-19 virus infection  Flu-like symptoms  Clinically stable.  No red flag signs or symptoms.  Advised to continue Mucinex and Tylenol as needed. COVID advice given.  ED precautions given. Advised to call the office if no better or worse during the next several days.   I discussed the assessment and treatment plan with the patient. The patient was provided an opportunity to ask questions and all were answered. The patient agreed with the plan and demonstrated an understanding of the instructions.   The patient was advised to call back or seek an in-person evaluation if the symptoms worsen or if the condition fails to improve as anticipated.  I provided 20 minutes of non-face-to-face time during this encounter.  Horald Pollen, MD  Primary Care at [facility]

## 2020-12-16 NOTE — Patient Instructions (Signed)
° ° ° °  If you have lab work done today you will be contacted with your lab results within the next 2 weeks.  If you have not heard from us then please contact us. The fastest way to get your results is to register for My Chart. ° ° °IF you received an x-ray today, you will receive an invoice from [facility]. Please contact [facility] at [phone] with questions or concerns regarding your invoice.  ° °IF you received labwork today, you will receive an invoice from [facility]. Please contact [facility] at [phone] with questions or concerns regarding your invoice.  ° °Our billing staff will not be able to assist you with questions regarding bills from these companies. ° °You will be contacted with the lab results as soon as they are available. The fastest way to get your results is to activate your My Chart account. Instructions are located on the last page of this paperwork. If you have not heard from us regarding the results in 2 weeks, please contact this office. °  ° ° ° °

## 2020-12-20 ENCOUNTER — Other Ambulatory Visit: Payer: Self-pay

## 2020-12-20 ENCOUNTER — Encounter: Payer: Self-pay | Admitting: Emergency Medicine

## 2020-12-20 ENCOUNTER — Telehealth (INDEPENDENT_AMBULATORY_CARE_PROVIDER_SITE_OTHER): Payer: Self-pay | Admitting: Emergency Medicine

## 2020-12-20 DIAGNOSIS — J0100 Acute maxillary sinusitis, unspecified: Secondary | ICD-10-CM

## 2020-12-20 DIAGNOSIS — U071 COVID-19: Secondary | ICD-10-CM

## 2020-12-20 DIAGNOSIS — R0981 Nasal congestion: Secondary | ICD-10-CM

## 2020-12-20 DIAGNOSIS — R059 Cough, unspecified: Secondary | ICD-10-CM

## 2020-12-20 MED ORDER — AZITHROMYCIN 250 MG PO TABS: ORAL_TABLET | ORAL | 0 refills | Status: DC

## 2020-12-20 MED ORDER — BENZONATATE 200 MG PO CAPS: 200.0000 mg | ORAL_CAPSULE | Freq: Two times a day (BID) | ORAL | 0 refills | Status: DC | PRN

## 2020-12-20 NOTE — Progress Notes (Addendum)
Telemedicine Encounter- SOAP NOTE Established Patient Patient: Home  Provider: Office     This telephone encounter was conducted with the patient's (or proxy's) verbal consent via audio telecommunications: yes Patient was instructed to have this encounter in a suitably private space; and to only have persons present to whom they give permission to participate. In addition, patient identity was confirmed by use of name plus two identifiers (DOB and address).  I discussed the limitations, risks, security and privacy concerns of performing an evaluation and management service by telephone and the availability of in person appointments. I also discussed with the patient that there may be a patient responsible charge related to this service. The patient expressed understanding and agreed to proceed.  I spent a total of 20 minutes talking with the patient or their proxy.  Chief Complaint  Patient presents with  . Covid Positive    A week ago and now she is Covid Neg Still has SX: Cough  Sore throat HA  Fever :98-100 Ear pain   F/u - wants something to treat sx    Subjective   Sheila [name] is a 60 y.o. female established patient. Telephone visit today for follow-up of COVID infection. Telemedicine visit with me on 12/16/2020.  Day 1 of COVID infection on 12/14/2020.  Still coughing and having some congestion but denies difficulty breathing or chest pain. Able to eat and drink.  Denies nausea or vomiting.  Denies abdominal pain or diarrhea.  Complaining of nasal congestion and purulent nasal discharge. Tested positive with an in-home test.  States her blood test last Saturday came back negative.  Son also recently had COVID-pneumonia.  Recovering well. No other complaints or medical concerns today.  HPI   Patient Active Problem List   Diagnosis Date Noted  . History of uterine fibroid 05/29/2017  . Menopause 05/29/2017  . Diverticulosis 02/18/2016  . HLD  (hyperlipidemia) 06/25/2015  . Prediabetes 06/25/2015  . Hypertension 04/17/2012    Past Medical History:  Diagnosis Date  . Diverticulitis   . Fibroids   . GERD (gastroesophageal reflux disease)   . High cholesterol   . Hyperlipidemia   . Hypertension     Current Outpatient Medications  Medication Sig Dispense Refill  . amLODipine (NORVASC) 5 MG tablet Take 1 tablet (5 mg total) by mouth daily. 90 tablet 1  . atorvastatin (LIPITOR) 20 MG tablet Take 1 tablet (20 mg total) by mouth daily. 90 tablet 3  . guaiFENesin (MUCINEX) 600 MG 12 hr tablet Take by mouth 2 (two) times daily.    . vitamin C (ASCORBIC ACID) 250 MG tablet Take 250 mg by mouth in the morning and at bedtime.    Marland Kitchen VITAMIN D, CHOLECALCIFEROL, PO Take 1 tablet by mouth as needed.    . vitamin E 200 UNIT capsule Take 200 Units by mouth daily.    . Zinc Sulfate (ZINC 15 PO) Take by mouth.     No current facility-administered medications for this visit.    Allergies  Allergen Reactions  . Naproxen Other (See Comments)  . Acetaminophen Other (See Comments)    dizziness  . Ace Inhibitors Cough    Social History   Socioeconomic History  . Marital status: Married    Spouse name: Not on file  . Number of children: Not on file  . Years of education: Not on file  . Highest education level: Not on file  Occupational History  . Not on file  Tobacco Use  .  Smoking status: Never Smoker  . Smokeless tobacco: Never Used  Vaping Use  . Vaping Use: Never used  Substance and Sexual Activity  . Alcohol use: No  . Drug use: No  . Sexual activity: Yes    Partners: Male    Birth control/protection: Surgical    Comment: 1st intercourse- 17, partners- 2, married- 26 yrs   Other Topics Concern  . Not on file  Social History Narrative   ** Merged History Encounter **       ** Merged History Encounter **       Social Determinants of Health   Financial Resource Strain: Not on file  Food Insecurity: Not on file   Transportation Needs: Not on file  Physical Activity: Not on file  Stress: Not on file  Social Connections: Not on file  Intimate Partner Violence: Not on file    Review of Systems  Constitutional: Negative.  Negative for chills and fever.  HENT: Positive for congestion and sinus pain.   Respiratory: Positive for cough.   Cardiovascular: Negative.  Negative for chest pain and palpitations.  Gastrointestinal: Negative.  Negative for abdominal pain, nausea and vomiting.  Genitourinary: Negative.   Skin: Negative.  Negative for rash.  All other systems reviewed and are negative.   Objective  Alert and oriented x3 in no apparent respiratory distress. Vitals as reported by the patient: There were no vitals filed for this visit.  [name] was seen today for covid positive.  Diagnoses and all orders for this visit:  COVID-19 virus infection  Cough -     benzonatate (TESSALON) 200 MG capsule; Take 1 capsule (200 mg total) by mouth 2 (two) times daily as needed for cough.  Sinus congestion  Acute non-recurrent maxillary sinusitis -     azithromycin (ZITHROMAX) 250 MG tablet; Sig as indicated    Clinically stable.  No red flag signs or symptoms.  However seems to be having a secondary bacterial sinus infection.  We will start azithromycin and Tessalon for cough. COVID advice given.  ED precautions given. Advised to call the office if no better or worse during the next several days.  I discussed the assessment and treatment plan with the patient. The patient was provided an opportunity to ask questions and all were answered. The patient agreed with the plan and demonstrated an understanding of the instructions.   The patient was advised to call back or seek an in-person evaluation if the symptoms worsen or if the condition fails to improve as anticipated.  I provided 20 minutes of non-face-to-face time during this encounter.  Horald Pollen, MD  Primary Care at [facility]

## 2020-12-20 NOTE — Patient Instructions (Signed)
° ° ° °  If you have lab work done today you will be contacted with your lab results within the next 2 weeks.  If you have not heard from us then please contact us. The fastest way to get your results is to register for My Chart. ° ° °IF you received an x-ray today, you will receive an invoice from [facility]. Please contact [facility] at [phone] with questions or concerns regarding your invoice.  ° °IF you received labwork today, you will receive an invoice from [facility]. Please contact [facility] at [phone] with questions or concerns regarding your invoice.  ° °Our billing staff will not be able to assist you with questions regarding bills from these companies. ° °You will be contacted with the lab results as soon as they are available. The fastest way to get your results is to activate your My Chart account. Instructions are located on the last page of this paperwork. If you have not heard from us regarding the results in 2 weeks, please contact this office. °  ° ° ° °

## 2020-12-21 ENCOUNTER — Telehealth: Payer: Self-pay | Admitting: Emergency Medicine

## 2020-12-21 NOTE — Telephone Encounter (Signed)
Low oxygen saturation and low blood pressure.  Patient needs to be seen in urgent care or emergency department today.  Thanks.

## 2020-12-21 NOTE — Telephone Encounter (Signed)
Received a fax after hours stating "pts had a oxygen level around 92% Dtr just checked her oxygen again and it was 91%. What should pt do? Pt does have covid and her BP is low. No reading avail." Please advise.

## 2020-12-21 NOTE — Telephone Encounter (Signed)
Pt reports O2 avg 92-91% and low BP but no reading. Pt Positive for COVID per message. Is this a concern at this time? Should pt be seen in urgent care make virtual with you or continue to monitor symptoms?

## 2020-12-21 NOTE — Telephone Encounter (Signed)
LM to inform pt advised ED UC for eval given low oxygen saturation and low BP combination

## 2020-12-26 ENCOUNTER — Other Ambulatory Visit: Payer: Self-pay

## 2020-12-26 ENCOUNTER — Emergency Department (HOSPITAL_COMMUNITY)
Admission: EM | Admit: 2020-12-26 | Discharge: 2020-12-26 | Disposition: A | Discharge status: Home / Self Care | Payer: HRSA Program | Attending: Emergency Medicine | Admitting: Emergency Medicine

## 2020-12-26 ENCOUNTER — Emergency Department (HOSPITAL_COMMUNITY): Payer: HRSA Program

## 2020-12-26 ENCOUNTER — Encounter (HOSPITAL_COMMUNITY): Payer: Self-pay | Admitting: Pediatrics

## 2020-12-26 DIAGNOSIS — R0602 Shortness of breath: Secondary | ICD-10-CM

## 2020-12-26 DIAGNOSIS — I10 Essential (primary) hypertension: Secondary | ICD-10-CM | POA: Insufficient documentation

## 2020-12-26 DIAGNOSIS — Z79899 Other long term (current) drug therapy: Secondary | ICD-10-CM | POA: Insufficient documentation

## 2020-12-26 DIAGNOSIS — R000 Tachycardia, unspecified: Secondary | ICD-10-CM | POA: Diagnosis not present

## 2020-12-26 DIAGNOSIS — U071 COVID-19: Secondary | ICD-10-CM | POA: Diagnosis not present

## 2020-12-26 LAB — BASIC METABOLIC PANEL
Anion gap: 13 (ref 5–15)
BUN: 10 mg/dL (ref 6–20)
CO2: 21 mmol/L — ABNORMAL LOW (ref 22–32)
Calcium: 8.9 mg/dL (ref 8.9–10.3)
Chloride: 103 mmol/L (ref 98–111)
Creatinine, Ser: 0.56 mg/dL (ref 0.44–1.00)
GFR, Estimated: >60 mL/min (ref >60)
Glucose, Bld: 127 mg/dL — ABNORMAL HIGH (ref 70–99)
Potassium: 3.4 mmol/L — ABNORMAL LOW (ref 3.5–5.1)
Sodium: 137 mmol/L (ref 135–145)

## 2020-12-26 LAB — CBC
HCT: 39 % (ref 36.0–46.0)
Hemoglobin: 13.2 g/dL (ref 12.0–15.0)
MCH: 28.9 pg (ref 26.0–34.0)
MCHC: 33.8 g/dL (ref 30.0–36.0)
MCV: 85.5 fL (ref 80.0–100.0)
Platelets: 435 10*3/uL — ABNORMAL HIGH (ref 150–400)
RBC: 4.56 MIL/uL (ref 3.87–5.11)
RDW: 12.9 % (ref 11.5–15.5)
WBC: 5.5 10*3/uL (ref 4.0–10.5)
nRBC: 0 % (ref 0.0–0.2)

## 2020-12-26 LAB — TROPONIN I (HIGH SENSITIVITY): Troponin I (High Sensitivity): 3 ng/L (ref <18)

## 2020-12-26 MED ORDER — IOHEXOL 350 MG/ML SOLN
80.0000 mL | Freq: Once | INTRAVENOUS | Status: AC | PRN
  Administered 2020-12-26: 80 mL via INTRAVENOUS

## 2020-12-26 NOTE — ED Notes (Signed)
Ambulating in room, spo2 drops to 93% on room air. Pt rr increased to 36.

## 2020-12-26 NOTE — Discharge Instructions (Addendum)
You came to the emergency department today to be evaluated for your shortness of breath with exertion.  Your chest x-ray showed signs consistent with COVID-19 pneumonia.  The CT scan of your chest showed NO pulmonary embolism.  This scan of your chest did show signs consistent with COVID-19 pneumonia.  Your shortness of breath is likely due to result of your recent COVID-19 infection.  I have sent your contact information to the COVID-19 treatment clinic, they will reach out to you if they have any available treatments for you.  Please follow-up with your primary care provider.    You can alternate Tylenol/acetaminophen and Advil/ibuprofen/Motrin every 4 hours for sore throat, body aches, headache or fever.  Drink plenty of water.  Wash your hands frequently. Please rest as needed with frequent repositioning and ambulation as tolerated.    Return to the ER for significant shortness of breath, uncontrollable vomiting, severe chest pain, inability to tolerate fluids, changes in mental status such as confusion or other concerning symptoms.

## 2020-12-26 NOTE — ED Triage Notes (Signed)
Reported tested + for covid 3 weeks ago; and was told she has fluid in lungs; denies fever at home; c/o feeling fatigue easily. Reported hx of HBP, high cholesterol, diverticulitis.  Denies pain when asked.

## 2020-12-26 NOTE — ED Provider Notes (Signed)
[name] Provider Note   CSN: [id number] Arrival date & time: 12/26/20  1021     History Chief Complaint  Patient presents with  . Weakness    [name] is a 60 y.o. female with a history of hypertension, hyperlipidemia, GERD, diverticulitis. Patient presents with a chief complaint of shortness of breath, is only covers with exertion, has been present for the last 8 days, no change over that time, associated with productive cough. Patient also reports pleuritic chest pain. Patient states she tested positive for COVID-19 3 weeks prior. Patient denies any covid-19 vaccination.  Patient denies any hemoptysis, recent surgery, active cancer treatment, history of DVT or PE, swelling, erythema or tenderness in lower extremities, hormone use.    Per chart review patient was seen via telehealth on 12/20/20. At that visit patient complained continued COVID-19 symptoms including cough, sore throat, and headache. Patient was prescribed 6-day course of azithromycin which she completed.  Per chart review patient was not hospitalized for her COVID-19 pneumonia.    Spanish interpreter was used to conduct this interview.  HPI     Past Medical History:  Diagnosis Date  . Diverticulitis   . Fibroids   . GERD (gastroesophageal reflux disease)   . High cholesterol   . Hyperlipidemia   . Hypertension     Patient Active Problem List   Diagnosis Date Noted  . History of uterine fibroid 05/29/2017  . Menopause 05/29/2017  . Diverticulosis 02/18/2016  . HLD (hyperlipidemia) 06/25/2015  . Prediabetes 06/25/2015  . Hypertension 04/17/2012    Past Surgical History:  Procedure Laterality Date  . HERNIA REPAIR    . TUBAL LIGATION       OB History    Gravida  5   Para  3   Term  0   Preterm  0   AB  2   Living  3     SAB  2   IAB  0   Ectopic  0   Multiple      Live Births              Family History  Problem  Relation Age of Onset  . Stomach cancer Mother   . Pneumonia Father   . Diabetes Sister   . Diabetes Brother   . Hypertension Sister   . Hyperlipidemia Sister   . Hypertension Sister   . Liver disease Neg Hx   . Pancreatic cancer Neg Hx   . Esophageal cancer Neg Hx   . Colon cancer Neg Hx     Social History   Tobacco Use  . Smoking status: Never Smoker  . Smokeless tobacco: Never Used  Vaping Use  . Vaping Use: Never used  Substance Use Topics  . Alcohol use: No  . Drug use: No    Home Medications Prior to Admission medications   Medication Sig Start Date End Date Taking? Authorizing Provider  amLODipine (NORVASC) 5 MG tablet Take 1 tablet (5 mg total) by mouth daily. 06/18/20   Horald Pollen, MD  atorvastatin (LIPITOR) 20 MG tablet Take 1 tablet (20 mg total) by mouth daily. 11/03/20   Horald Pollen, MD  azithromycin [facility]) 250 MG tablet Sig as indicated 12/20/20   Horald Pollen, MD  benzonatate (TESSALON) 200 MG capsule Take 1 capsule (200 mg total) by mouth 2 (two) times daily as needed for cough. 12/20/20   Horald Pollen, MD  guaiFENesin (MUCINEX) 600 MG 12 hr  tablet Take by mouth 2 (two) times daily.    [provider]  vitamin C (ASCORBIC ACID) 250 MG tablet Take 250 mg by mouth in the morning and at bedtime.    [provider]  VITAMIN D, CHOLECALCIFEROL, PO Take 1 tablet by mouth as needed.    [provider]  vitamin E 200 UNIT capsule Take 200 Units by mouth daily.    [provider]  Zinc Sulfate (ZINC 15 PO) Take by mouth.    [provider]    Allergies    Naproxen, Acetaminophen, and Ace inhibitors  Review of Systems   Review of Systems  Constitutional: Negative for chills and fever.  Eyes: Negative for visual disturbance.  Respiratory: Positive for cough (productive) and shortness of breath (with exertion).   Cardiovascular: Positive for chest pain (Pleuritic). Negative for  palpitations and leg swelling.  Gastrointestinal: Negative for abdominal pain, nausea and vomiting.  Genitourinary: Negative for difficulty urinating and dysuria.  Musculoskeletal: Negative for back pain and neck pain.  Skin: Negative for color change and rash.  Neurological: Negative for dizziness, syncope, light-headedness and headaches.  Psychiatric/Behavioral: Negative for confusion.    Physical Exam Updated Vital Signs BP 121/80   Pulse (!) 102   Temp 98.4 F (36.9 C) (Oral)   Resp 20   Ht 5\' 2"  (1.575 m)   Wt 77.1 kg   LMP 05/08/2017 (Approximate)   SpO2 97%   BMI 31.09 kg/m   Physical Exam Vitals and nursing note reviewed.  Constitutional:      General: She is not in acute distress.    Appearance: She is obese. She is not ill-appearing, toxic-appearing or diaphoretic.  HENT:     Head: Normocephalic.  Eyes:     General: No scleral icterus.       Right eye: No discharge.        Left eye: No discharge.  Cardiovascular:     Rate and Rhythm: Tachycardia present.     Pulses:          Dorsalis pedis pulses are 3+ on the right side and 3+ on the left side.     Heart sounds: Normal heart sounds.  Pulmonary:     Effort: Pulmonary effort is normal. No respiratory distress.     Breath sounds: Normal breath sounds. No stridor. No wheezing, rhonchi or rales.  Chest:     Chest wall: No tenderness.  Abdominal:     General: There is no distension.     Palpations: Abdomen is soft. There is no mass.     Tenderness: There is no abdominal tenderness.  Musculoskeletal:     Cervical back: Neck supple.     Right lower leg: No swelling, tenderness or bony tenderness. No edema.     Left lower leg: No swelling, tenderness or bony tenderness. No edema.  Skin:    General: Skin is warm and dry.     Coloration: Skin is not jaundiced or pale.  Neurological:     General: No focal deficit present.     Mental Status: She is alert.  Psychiatric:        Behavior: Behavior is cooperative.      ED Results / Procedures / Treatments   Labs (all labs ordered are listed, but only abnormal results are displayed) Labs Reviewed  BASIC METABOLIC PANEL - Abnormal; Notable for the following components:      Result Value   Potassium 3.4 (*)    CO2 21 (*)  Glucose, Bld 127 (*)    All other components within normal limits  CBC - Abnormal; Notable for the following components:   Platelets 435 (*)    All other components within normal limits  URINALYSIS, ROUTINE W REFLEX MICROSCOPIC  CBG MONITORING, ED    EKG EKG Interpretation  Date/Time:  Sunday December 26 2020 11:01:56 EST Ventricular Rate:  115 PR Interval:  134 QRS Duration: 76 QT Interval:  310 QTC Calculation: 428 R Axis:   5 Text Interpretation: Sinus tachycardia Otherwise normal ECG Confirmed by Quintella Reichert [id number]) on 12/26/2020 4:33:21 PM   Radiology DG Chest 2 View  Result Date: 12/26/2020 CLINICAL DATA:  COVID positive.  Weakness. EXAM: CHEST - 2 VIEW COMPARISON:  None. FINDINGS: The heart size and mediastinal contours are within normal limits. Both patchy opacities are identified throughout bilateral mid and lower lungs. There is no pleural effusion. The visualized skeletal structures are unremarkable. IMPRESSION: Bilateral pneumonias. Electronically Signed   By: Abelardo Diesel M.D.   On: 12/26/2020 11:23    Procedures Procedures (including critical care time)  Medications Ordered in ED Medications - No data to display  ED Course  I have reviewed the triage vital signs and the nursing notes.  Pertinent labs & imaging results that were available during my care of the patient were reviewed by me and considered in my medical decision making (see chart for details).    MDM Rules/Calculators/A&P                          Spanish interpreter was used to conduct interview.   Alert 60 year old female in no acute distress, nontoxic appearing. Patient presents with chief complaint of exertional chest  pain. Patient reports she tested positive for COVID-19 3 weeks prior. Patient denies receiving vaccination for COVID-19. Patient also endorses pleuritic chest pain. Noted to be tachycardic.    Lungs clear to auscultation bilaterally, heart sounds normal with no murmurs rubs or gallops, no chest wall tenderness, oxygen saturation between 92 to 97% on room air.    Chest x-ray shows no pleural effusion or pneumothorax; bilateral pneumonias noted. Patient recently completed 6-day course of azithromycin. Likely pneumonia noted is secondary to her recent COVID-19 infection.  CBC showed slight increase in platelet count at 435. BMP showed potassium slightly low at 3.4, bicarb slightly lower at 21, glucose slightly elevated at 127.  With patient's recent history of COVID-19 infection, tachycardia and pleuritic chest pain concern for pulmonary embolism. CT angio chest ordered and pending.  Troponin ordered and pending.    CT angio chest showed no evidence of pulmonary embolus; multifocal bilateral airspace disease consistent with COVID-19 pneumonia.  When ambulating in room patient's SPO2 dropped to 93% and respiration rate increased to 36.    EKG shows sinus tachycardia.  Troponin found to be 3. Less concerning for ACS.    Likely that patient's shortness of breath is related to her recent COVID-19 infection.  Oxygen saturation 95% or greater on room air at rest.  Patient's tachycardia resolved with pulse rate at 98.  Will give patient ambulatory referral to COVID-19 treatment clinic due to her obesity and history of hypertension.  Discussed results, findings, treatment and follow up. Patient advised of return precautions. Patient verbalized understanding and agreed with plan.  Patient was discussed with and evaluated by Dr. Ralene Bathe.  Daisymae Brumett was evaluated in Emergency Department on 12/26/2020 for the symptoms described in the history of present illness. She was  evaluated in the context of  the global COVID-19 pandemic, which necessitated consideration that the patient might be at risk for infection with the SARS-CoV-2 virus that causes COVID-19. Institutional protocols and algorithms that pertain to the evaluation of patients at risk for COVID-19 are in a state of rapid change based on information released by regulatory bodies including the CDC and federal and state organizations. These policies and algorithms were followed during the patient's care in the ED.   Final Clinical Impression(s) / ED Diagnoses Final diagnoses:  Shortness of breath  COVID-19    Rx / DC Orders ED Discharge Orders         Ordered    Ambulatory referral for Covid Treatment        12/26/20 1938           Dyann Ruddle 12/26/20 2036    Quintella Reichert, MD 12/30/20 [phone]

## 2021-01-05 ENCOUNTER — Ambulatory Visit (INDEPENDENT_AMBULATORY_CARE_PROVIDER_SITE_OTHER): Payer: Self-pay | Admitting: Emergency Medicine

## 2021-01-05 ENCOUNTER — Other Ambulatory Visit: Payer: Self-pay

## 2021-01-05 ENCOUNTER — Encounter: Payer: Self-pay | Admitting: Emergency Medicine

## 2021-01-05 VITALS — BP 119/76 | HR 97 | Temp 98.7°F | Resp 16 | Ht 59.0 in | Wt 171.0 lb

## 2021-01-05 DIAGNOSIS — U071 COVID-19: Secondary | ICD-10-CM

## 2021-01-05 DIAGNOSIS — Z8616 Personal history of COVID-19: Secondary | ICD-10-CM

## 2021-01-05 DIAGNOSIS — G933 Postviral fatigue syndrome: Secondary | ICD-10-CM

## 2021-01-05 DIAGNOSIS — G9331 Postviral fatigue syndrome: Secondary | ICD-10-CM

## 2021-01-05 NOTE — Patient Instructions (Addendum)
   If you have lab work done today you will be contacted with your lab results within the next 2 weeks.  If you have not heard from us then please contact us. The fastest way to get your results is to register for My Chart.   IF you received an x-ray today, you will receive an invoice from [facility]. Please contact [facility] at [phone] with questions or concerns regarding your invoice.   IF you received labwork today, you will receive an invoice from [facility]. Please contact [facility] at [phone] with questions or concerns regarding your invoice.   Our billing staff will not be able to assist you with questions regarding bills from these companies.  You will be contacted with the lab results as soon as they are available. The fastest way to get your results is to activate your My Chart account. Instructions are located on the last page of this paperwork. If you have not heard from us regarding the results in 2 weeks, please contact this office.      Mantenimiento de la salud en las mujeres Health Maintenance, Female Adoptar un estilo de vida saludable y recibir atencin preventiva son importantes para promover la salud y el bienestar. Consulte al mdico sobre:  El esquema adecuado para hacerse pruebas y exmenes peridicos.  Cosas que puede hacer por su cuenta para prevenir enfermedades y mantenerse sana. Qu debo saber sobre la dieta, el peso y el ejercicio? Consuma una dieta saludable  Consuma una dieta que incluya muchas verduras, frutas, productos lcteos con bajo contenido de grasa y protenas magras.  No consuma muchos alimentos ricos en grasas slidas, azcares agregados o sodio.   Mantenga un peso saludable El ndice de masa muscular (IMC) se utiliza para identificar problemas de peso. Proporciona una estimacin de la grasa corporal basndose en el peso y la altura. Su mdico puede ayudarle a determinar su IMC y a lograr o mantener un peso  saludable. Haga ejercicio con regularidad Haga ejercicio con regularidad. Esta es una de las prcticas ms importantes que puede hacer por su salud. La mayora de los adultos deben seguir estas pautas:  Realizar, al menos, 150minutos de actividad fsica por semana. El ejercicio debe aumentar la frecuencia cardaca y hacerlo transpirar (ejercicio de intensidad moderada).  Hacer ejercicios de fortalecimiento por lo menos dos veces por semana. Agregue esto a su plan de ejercicio de intensidad moderada.  Pasar menos tiempo sentados. Incluso la actividad fsica ligera puede ser beneficiosa. Controle sus niveles de colesterol y lpidos en la sangre Comience a realizarse anlisis de lpidos y colesterol en la sangre a los 20aos y luego reptalos cada 5aos. Hgase controlar los niveles de colesterol con mayor frecuencia si:  Sus niveles de lpidos y colesterol son altos.  Es mayor de 40aos.  Presenta un alto riesgo de padecer enfermedades cardacas. Qu debo saber sobre las pruebas de deteccin del cncer? Segn su historia clnica y sus antecedentes familiares, es posible que deba realizarse pruebas de deteccin del cncer en diferentes edades. Esto puede incluir pruebas de deteccin de lo siguiente:  Cncer de mama.  Cncer de cuello uterino.  Cncer colorrectal.  Cncer de piel.  Cncer de pulmn. Qu debo saber sobre la enfermedad cardaca, la diabetes y la hipertensin arterial? Presin arterial y enfermedad cardaca  La hipertensin arterial causa enfermedades cardacas y aumenta el riesgo de accidente cerebrovascular. Es ms probable que esto se manifieste en las personas que tienen lecturas de presin arterial alta, tienen ascendencia   africana o tienen sobrepeso.  Hgase controlar la presin arterial: ? Cada 3 a 5 aos si tiene entre 18 y 39 aos. ? Todos los aos si es mayor de 40aos. Diabetes Realcese exmenes de deteccin de la diabetes con regularidad. Este  anlisis revisa el nivel de azcar en la sangre en ayunas. Hgase las pruebas de deteccin:  Cada tresaos despus de los 40aos de edad si tiene un peso normal y un bajo riesgo de padecer diabetes.  Con ms frecuencia y a partir de una edad inferior si tiene sobrepeso o un alto riesgo de padecer diabetes. Qu debo saber sobre la prevencin de infecciones? Hepatitis B Si tiene un riesgo ms alto de contraer hepatitis B, debe someterse a un examen de deteccin de este virus. Hable con el mdico para averiguar si tiene riesgo de contraer la infeccin por hepatitis B. Hepatitis C Se recomienda el anlisis a:  Todos los que nacieron entre 1945 y 1965.  Todas las personas que tengan un riesgo de haber contrado hepatitis C. Enfermedades de transmisin sexual (ETS)  Hgase las pruebas de deteccin de ITS, incluidas la gonorrea y la clamidia, si: ? Es sexualmente activa y es menor de 24aos. ? Es mayor de 24aos, y el mdico le informa que corre riesgo de tener este tipo de infecciones. ? La actividad sexual ha cambiado desde que le hicieron la ltima prueba de deteccin y tiene un riesgo mayor de tener clamidia o gonorrea. Pregntele al mdico si usted tiene riesgo.  Pregntele al mdico si usted tiene un alto riesgo de contraer VIH. El mdico tambin puede recomendarle un medicamento recetado para ayudar a evitar la infeccin por el VIH. Si elige tomar medicamentos para prevenir el VIH, primero debe hacerse los anlisis de deteccin del VIH. Luego debe hacerse anlisis cada 3meses mientras est tomando los medicamentos. Embarazo  Si est por dejar de menstruar (fase premenopusica) y usted puede quedar embarazada, busque asesoramiento antes de quedar embarazada.  Tome de 400 a 800microgramos (mcg) de cido flico todos los das si queda embarazada.  Pida mtodos de control de la natalidad (anticonceptivos) si desea evitar un embarazo no deseado. Osteoporosis y menopausia La  osteoporosis es una enfermedad en la que los huesos pierden los minerales y la fuerza por el avance de la edad. El resultado pueden ser fracturas en los huesos. Si tiene 65aos o ms, o si est en riesgo de sufrir osteoporosis y fracturas, pregunte a su mdico si debe:  Hacerse pruebas de deteccin de prdida sea.  Tomar un suplemento de calcio o de vitamina D para reducir el riesgo de fracturas.  Recibir terapia de reemplazo hormonal (TRH) para tratar los sntomas de la menopausia. Siga estas instrucciones en su casa: Estilo de vida  No consuma ningn producto que contenga nicotina o tabaco, como cigarrillos, cigarrillos electrnicos y tabaco de mascar. Si necesita ayuda para dejar de fumar, consulte al mdico.  No consuma drogas.  No comparta agujas.  Solicite ayuda a su mdico si necesita apoyo o informacin para abandonar las drogas. Consumo de alcohol  No beba alcohol si: ? Su mdico le indica no hacerlo. ? Est embarazada, puede estar embarazada o est tratando de quedar embarazada.  Si bebe alcohol: ? Limite la cantidad que consume de 0 a 1 medida por da. ? Limite la ingesta si est amamantando.  Est atento a la cantidad de alcohol que hay en las bebidas que toma. En [place], una medida equivale a una botella de cerveza de   12oz (355ml), un vaso de vino de 5oz (148ml) o un vaso de una bebida alcohlica de alta graduacin de 1oz (44ml). Instrucciones generales  Realcese los estudios de rutina de la salud, dentales y de la vista.  Mantngase al da con las vacunas.  Infrmele a su mdico si: ? Se siente deprimida con frecuencia. ? Alguna vez ha sido vctima de maltrato o no se siente segura en su casa. Resumen  Adoptar un estilo de vida saludable y recibir atencin preventiva son importantes para promover la salud y el bienestar.  Siga las instrucciones del mdico acerca de una dieta saludable, el ejercicio y la realizacin de pruebas o exmenes  para detectar enfermedades.  Siga las instrucciones del mdico con respecto al control del colesterol y la presin arterial. Esta informacin no tiene como fin reemplazar el consejo del mdico. Asegrese de hacerle al mdico cualquier pregunta que tenga. Document Revised: 12/11/2018 Document Reviewed: 12/11/2018 Elsevier Patient Education  2021 [employer]  

## 2021-01-05 NOTE — Progress Notes (Signed)
Sheila [name] 60 y.o.   Chief Complaint  Patient presents with  . Fatigue    Per patient follow up after having Covid and having just a little fatigue.    HISTORY OF PRESENT ILLNESS: This is a 61 y.o. female status post Covid infection early last month.  Recovering well but still having a little bit of fatigue. No other complaints or medical concerns today.  HPI   Prior to Admission medications   Medication Sig Start Date End Date Taking? Authorizing Provider  amLODipine (NORVASC) 5 MG tablet Take 1 tablet (5 mg total) by mouth daily. 06/18/20   Horald Pollen, MD  atorvastatin (LIPITOR) 20 MG tablet Take 1 tablet (20 mg total) by mouth daily. 11/03/20   Horald Pollen, MD  benzonatate (TESSALON) 200 MG capsule Take 1 capsule (200 mg total) by mouth 2 (two) times daily as needed for cough. 12/20/20   Horald Pollen, MD  vitamin C (ASCORBIC ACID) 250 MG tablet Take 250 mg by mouth in the morning and at bedtime.    [provider]  VITAMIN D, CHOLECALCIFEROL, PO Take 1 tablet by mouth as needed.    [provider]  vitamin E 200 UNIT capsule Take 200 Units by mouth daily.    [provider]  Zinc Sulfate (ZINC 15 PO) Take by mouth.    [provider]    Allergies  Allergen Reactions  . Naproxen Other (See Comments)  . Acetaminophen Other (See Comments)    dizziness  . Ace Inhibitors Cough    Patient Active Problem List   Diagnosis Date Noted  . History of uterine fibroid 05/29/2017  . Menopause 05/29/2017  . Diverticulosis 02/18/2016  . HLD (hyperlipidemia) 06/25/2015  . Prediabetes 06/25/2015  . Hypertension 04/17/2012    Past Medical History:  Diagnosis Date  . Diverticulitis   . Fibroids   . GERD (gastroesophageal reflux disease)   . High cholesterol   . Hyperlipidemia   . Hypertension     Past Surgical History:  Procedure Laterality Date  . HERNIA REPAIR    . TUBAL LIGATION      Social  History   Socioeconomic History  . Marital status: Married    Spouse name: Not on file  . Number of children: Not on file  . Years of education: Not on file  . Highest education level: Not on file  Occupational History  . Not on file  Tobacco Use  . Smoking status: Never Smoker  . Smokeless tobacco: Never Used  Vaping Use  . Vaping Use: Never used  Substance and Sexual Activity  . Alcohol use: No  . Drug use: No  . Sexual activity: Yes    Partners: Male    Birth control/protection: Surgical    Comment: 1st intercourse- 17, partners- 2, married- 87 yrs   Other Topics Concern  . Not on file  Social History Narrative   ** Merged History Encounter **       ** Merged History Encounter **       Social Determinants of Health   Financial Resource Strain: Not on file  Food Insecurity: Not on file  Transportation Needs: Not on file  Physical Activity: Not on file  Stress: Not on file  Social Connections: Not on file  Intimate Partner Violence: Not on file    Family History  Problem Relation Age of Onset  . Stomach cancer Mother   . Pneumonia Father   . Diabetes Sister   .  Diabetes Brother   . Hypertension Sister   . Hyperlipidemia Sister   . Hypertension Sister   . Liver disease Neg Hx   . Pancreatic cancer Neg Hx   . Esophageal cancer Neg Hx   . Colon cancer Neg Hx      Review of Systems  Constitutional: Positive for malaise/fatigue. Negative for fever.  HENT: Negative.  Negative for congestion and sore throat.   Respiratory: Negative.  Negative for cough and shortness of breath.   Cardiovascular: Negative.  Negative for chest pain and palpitations.  Gastrointestinal: Negative.  Negative for abdominal pain, diarrhea, nausea and vomiting.  Genitourinary: Negative.  Negative for dysuria and hematuria.  Musculoskeletal: Negative.  Negative for back pain, myalgias and neck pain.  Skin: Negative.  Negative for rash.  Neurological: Negative.  Negative for dizziness  and headaches.  All other systems reviewed and are negative.   Vitals:   01/05/21 1527  BP: 119/76  Pulse: 97  Resp: 16  Temp: 98.7 F (37.1 C)  SpO2: 97%    Physical Exam Vitals reviewed.  Constitutional:      Appearance: Normal appearance.  HENT:     Head: Normocephalic.  Eyes:     Extraocular Movements: Extraocular movements intact.     Conjunctiva/sclera: Conjunctivae normal.     Pupils: Pupils are equal, round, and reactive to light.  Cardiovascular:     Rate and Rhythm: Normal rate and regular rhythm.     Pulses: Normal pulses.     Heart sounds: Normal heart sounds.  Pulmonary:     Effort: Pulmonary effort is normal.     Breath sounds: Normal breath sounds.  Musculoskeletal:        General: Normal range of motion.     Cervical back: Normal range of motion and neck supple.  Skin:    General: Skin is warm and dry.     Capillary Refill: Capillary refill takes less than 2 seconds.  Neurological:     General: No focal deficit present.     Mental Status: She is alert and oriented to person, place, and time.  Psychiatric:        Mood and Affect: Mood normal.        Behavior: Behavior normal.      ASSESSMENT & PLAN: Clinically stable. Recovering well from Covid. No complications. No medical concerns identified during this visit.  Moselle was seen today for fatigue.  Diagnoses and all orders for this visit:  Post viral syndrome -     CBC with Differential/Platelet -     Comprehensive metabolic panel -     SAR CoV2 Serology (COVID 19)AB(IGG)IA  COVID-19 virus infection Comments: Recent, much improved Orders: -     SAR CoV2 Serology (COVID 19)AB(IGG)IA  Postviral fatigue syndrome    Patient Instructions       If you have lab work done today you will be contacted with your lab results within the next 2 weeks.  If you have not heard from [place] then please contact us. The fastest way to get your results is to register for My Chart.   IF you received an  x-ray today, you will receive an invoice from [facility]. Please contact [facility] at [phone] with questions or concerns regarding your invoice.   IF you received labwork today, you will receive an invoice from [facility]. Please contact [facility] at [phone] with questions or concerns regarding your invoice.   Our billing staff will not be able to assist you  with questions regarding bills from these companies.  You will be contacted with the lab results as soon as they are available. The fastest way to get your results is to activate your My Chart account. Instructions are located on the last page of this paperwork. If you have not heard from [place] regarding the results in 2 weeks, please contact this office.     Mantenimiento de Technical sales engineer en [employer], Female Adoptar un estilo de vida saludable y recibir atencin preventiva son importantes para promover la salud y Musician. Consulte al mdico sobre:  El esquema adecuado para hacerse pruebas y exmenes peridicos.  Cosas que puede hacer por su cuenta para prevenir enfermedades y [employer]. Qu debo saber sobre la dieta, el peso y el ejercicio? Consuma una dieta saludable  Consuma una dieta que incluya muchas verduras, frutas, productos lcteos con bajo contenido de [place] y Advertising account planner.  No consuma muchos alimentos ricos en grasas slidas, azcares agregados o sodio.   Mantenga un peso saludable El ndice de masa muscular [facility]) se [place] para identificar problemas de [place]. Proporciona una estimacin de la grasa corporal basndose en el peso y la altura. Su mdico puede ayudarle a Radiation protection practitioner [place] y a Scientist, forensic o Theatre manager un peso saludable. Haga ejercicio con regularidad Haga ejercicio con regularidad. Esta es una de las prcticas ms importantes que puede hacer por su salud. La mayora de los adultos deben seguir estas pautas:  Optometrist, al menos, 14minutos de actividad fsica  por semana. El ejercicio debe aumentar la frecuencia cardaca y Nature conservation officer transpirar (ejercicio de intensidad moderada).  Hacer ejercicios de fortalecimiento por lo [employer] por semana. Agregue esto a su plan de ejercicio de intensidad moderada.  Pasar menos tiempo sentados. Incluso la actividad fsica ligera puede ser beneficiosa. Controle sus niveles de colesterol y lpidos en la sangre Comience a realizarse anlisis de lpidos y Research officer, trade union en la sangre a los 20aos y luego reptalos cada 5aos. Hgase controlar los niveles de colesterol con mayor frecuencia si:  Sus niveles de lpidos y colesterol son altos.  Es mayor de 40aos.  Presenta un alto riesgo de padecer enfermedades cardacas. Qu debo saber sobre las pruebas de deteccin del cncer? Segn su historia clnica y sus antecedentes familiares, es posible que deba realizarse pruebas de deteccin del cncer en diferentes edades. Esto puede incluir pruebas de deteccin de lo siguiente:  Cncer de mama.  Cncer de cuello uterino.  Cncer colorrectal.  Cncer de piel.  Cncer de pulmn. Qu debo saber sobre la enfermedad cardaca, la diabetes y la hipertensin arterial? Presin arterial y enfermedad cardaca  La hipertensin arterial causa enfermedades cardacas y [place] el riesgo de accidente cerebrovascular. Es ms probable que esto se manifieste en las personas que tienen lecturas de presin arterial alta, tienen ascendencia africana o tienen sobrepeso.  Hgase controlar la presin arterial: ? Cada 3 a 5 aos si tiene entre 18 y 39 aos. ? Todos los aos si es mayor de [place]. Diabetes Realcese exmenes de deteccin de la diabetes con regularidad. Este anlisis revisa el nivel de azcar en la sangre en [place]. Hgase las pruebas de deteccin:  Cada tresaos despus de los 15aos de edad si tiene un peso normal y un bajo riesgo de padecer diabetes.  Con ms frecuencia y a partir de [place] edad inferior si  tiene sobrepeso o un alto riesgo de padecer diabetes. Qu debo saber sobre la prevencin de infecciones? Hepatitis B Si tiene un riesgo  ms alto de contraer hepatitis B, debe someterse a un examen de deteccin de este virus. Hable con el mdico para averiguar si tiene riesgo de contraer la infeccin por hepatitis B. Hepatitis C Se recomienda el anlisis a:  [employer] 1945 y 1965.  Todas las personas que tengan un riesgo de haber contrado hepatitis C. Enfermedades de transmisin sexual (ETS)  Hgase las pruebas de Programme researcher, broadcasting/film/video de ITS, incluidas la gonorrea y la clamidia, si: ? Es sexualmente activa y es menor de [place]. ? Es mayor de 24aos, y Investment banker, operational informa que corre riesgo de tener este tipo de infecciones. ? La actividad sexual ha cambiado desde que le hicieron la ltima prueba de deteccin y tiene un riesgo mayor de Best boy clamidia o Radio broadcast assistant. Pregntele al mdico si usted tiene riesgo.  Pregntele al mdico si usted tiene un alto riesgo de Museum/gallery curator VIH. El mdico tambin puede recomendarle un medicamento recetado para ayudar a evitar la infeccin por el VIH. Si elige tomar medicamentos para prevenir el VIH, primero debe [employer] de deteccin del VIH. Luego debe hacerse anlisis cada 49meses mientras est tomando los medicamentos. Embarazo  Si est por dejar de Librarian, academic (fase premenopusica) y usted puede quedar [place], busque asesoramiento antes de [place].  Tome de 400 a [id number] (mcg) de cido [employer] si [place].  Pida mtodos de control de la natalidad (anticonceptivos) si desea evitar un embarazo no deseado. Osteoporosis y [place] La osteoporosis es una enfermedad en la que los huesos pierden los minerales y la fuerza por el avance de la edad. El resultado pueden ser fracturas en los [place]. Si tiene 65aos o ms, o si est en riesgo de sufrir osteoporosis y fracturas, pregunte a su mdico si  debe:  Hacerse pruebas de deteccin de prdida sea.  Tomar un suplemento de calcio o de vitamina D para reducir el riesgo de fracturas.  Recibir terapia de reemplazo hormonal (TRH) para tratar los sntomas de la menopausia. Siga estas instrucciones en su casa: Estilo de vida  No consuma ningn producto que contenga nicotina o tabaco, como cigarrillos, cigarrillos electrnicos y tabaco de Higher education careers adviser. Si necesita ayuda para dejar de fumar, consulte al mdico.  No consuma drogas.  No comparta agujas.  Solicite ayuda a su mdico si necesita apoyo o informacin para abandonar las drogas. Consumo de alcohol  No beba alcohol si: ? Su mdico le indica no hacerlo. ? Est embarazada, puede estar embarazada o est tratando de quedar embarazada.  Si bebe alcohol: ? Limite la cantidad que consume de 0 a 1 medida por da. ? Limite la ingesta si est amamantando.  Est atento a la cantidad de alcohol que hay en las bebidas que toma. En los [place], una medida equivale a una botella de cerveza de 12oz (374ml), un vaso de vino de 5oz (169ml) o un vaso de una bebida alcohlica de alta graduacin de 1oz (39ml). Instrucciones generales  Realcese los estudios de rutina de la salud, dentales y de Public librarian.  [place].  Infrmele a su mdico si: ? Se siente deprimida con frecuencia. ? Alguna vez ha sido vctima de [place] o no se siente segura en su casa. Resumen  Adoptar un estilo de vida saludable y recibir atencin preventiva son importantes para promover la salud y Musician.  Siga las instrucciones del mdico acerca de una dieta saludable, el ejercicio y la realizacin de pruebas o exmenes para Hydrographic surveyor  enfermedades.  Siga las instrucciones del mdico con respecto al control del colesterol y la presin arterial. Esta informacin no tiene Marine scientist el consejo del mdico. Asegrese de hacerle al mdico cualquier pregunta que tenga. Document  Revised: 12/11/2018 Document Reviewed: 12/11/2018 Elsevier Patient Education  2021 Elsevier Inc.      Agustina Caroli, MD [facility]

## 2021-01-06 LAB — CBC WITH DIFFERENTIAL/PLATELET
Basophils Absolute: 0.1 10*3/uL (ref 0.0–0.2)
Basos: 1 %
EOS (ABSOLUTE): 0.1 10*3/uL (ref 0.0–0.4)
Eos: 2 %
Hematocrit: 35.3 % (ref 34.0–46.6)
Hemoglobin: 11.7 g/dL (ref 11.1–15.9)
Immature Grans (Abs): 0 10*3/uL (ref 0.0–0.1)
Immature Granulocytes: 0 %
Lymphocytes Absolute: 2.5 10*3/uL (ref 0.7–3.1)
Lymphs: 27 %
MCH: 28.3 pg (ref 26.6–33.0)
MCHC: 33.1 g/dL (ref 31.5–35.7)
MCV: 85 fL (ref 79–97)
Monocytes Absolute: 0.8 10*3/uL (ref 0.1–0.9)
Monocytes: 9 %
Neutrophils Absolute: 5.7 10*3/uL (ref 1.4–7.0)
Neutrophils: 61 %
Platelets: 401 10*3/uL (ref 150–450)
RBC: 4.14 x10E6/uL (ref 3.77–5.28)
RDW: 13 % (ref 11.7–15.4)
WBC: 9.2 10*3/uL (ref 3.4–10.8)

## 2021-01-06 LAB — COMPREHENSIVE METABOLIC PANEL
ALT: 18 IU/L (ref 0–32)
AST: 18 IU/L (ref 0–40)
Albumin/Globulin Ratio: 1.4 (ref 1.2–2.2)
Albumin: 4 g/dL (ref 3.8–4.9)
Alkaline Phosphatase: 116 IU/L (ref 44–121)
BUN/Creatinine Ratio: 21 (ref 12–28)
BUN: 15 mg/dL (ref 8–27)
Bilirubin Total: 0.3 mg/dL (ref 0.0–1.2)
CO2: 24 mmol/L (ref 20–29)
Calcium: 9 mg/dL (ref 8.7–10.3)
Chloride: 100 mmol/L (ref 96–106)
Creatinine, Ser: 0.71 mg/dL (ref 0.57–1.00)
GFR calc Af Amer: 107 mL/min/{1.73_m2} (ref >59)
GFR calc non Af Amer: 93 mL/min/{1.73_m2} (ref >59)
Globulin, Total: 2.9 g/dL (ref 1.5–4.5)
Glucose: 118 mg/dL — ABNORMAL HIGH (ref 65–99)
Potassium: 4.1 mmol/L (ref 3.5–5.2)
Sodium: 143 mmol/L (ref 134–144)
Total Protein: 6.9 g/dL (ref 6.0–8.5)

## 2021-01-06 LAB — SAR COV2 SEROLOGY (COVID19)AB(IGG),IA
SARS-CoV-2 Semi-Quant IgG Ab: 329 AU/mL (ref <13.0)
SARS-CoV-2 Spike Ab Interp: POSITIVE

## 2021-04-04 ENCOUNTER — Encounter: Payer: Self-pay | Admitting: Emergency Medicine

## 2021-04-04 ENCOUNTER — Other Ambulatory Visit: Payer: Self-pay

## 2021-04-04 ENCOUNTER — Ambulatory Visit (INDEPENDENT_AMBULATORY_CARE_PROVIDER_SITE_OTHER): Payer: Self-pay | Admitting: Emergency Medicine

## 2021-04-04 VITALS — BP 138/88 | HR 94 | Temp 99.7°F | Ht 60.24 in | Wt 179.8 lb

## 2021-04-04 DIAGNOSIS — R7303 Prediabetes: Secondary | ICD-10-CM

## 2021-04-04 DIAGNOSIS — E785 Hyperlipidemia, unspecified: Secondary | ICD-10-CM

## 2021-04-04 DIAGNOSIS — I10 Essential (primary) hypertension: Secondary | ICD-10-CM

## 2021-04-04 NOTE — Progress Notes (Signed)
Sheila [name] 60 y.o.   Chief Complaint  Patient presents with  . Hypertension    Follow up 3 months and chronic medical conditions    HISTORY OF PRESENT ILLNESS: This is a 60 y.o. female with history of hypertension here for follow-up. Presently on amlodipine 5 mg daily. Also takes atorvastatin 20 mg daily. Doing well.  Exercising more and eating better. Has no complaints or medical concerns today.  HPI   Prior to Admission medications   Medication Sig Start Date End Date Taking? Authorizing Provider  amLODipine (NORVASC) 5 MG tablet Take 1 tablet (5 mg total) by mouth daily. 06/18/20  Yes Darick Fetters, Ines Bloomer, MD  atorvastatin (LIPITOR) 20 MG tablet Take 1 tablet (20 mg total) by mouth daily. 11/03/20  Yes Jailey Booton, Ines Bloomer, MD  vitamin C (ASCORBIC ACID) 250 MG tablet Take 250 mg by mouth in the morning and at bedtime.   Yes [provider]  VITAMIN D, CHOLECALCIFEROL, PO Take 1 tablet by mouth as needed.   Yes [provider]  vitamin E 200 UNIT capsule Take 200 Units by mouth daily.   Yes [provider]  Zinc Sulfate (ZINC 15 PO) Take by mouth.   Yes [provider]  benzonatate (TESSALON) 200 MG capsule Take 1 capsule (200 mg total) by mouth 2 (two) times daily as needed for cough. Patient not taking: Reported on 04/04/2021 12/20/20   Horald Pollen, MD    Allergies  Allergen Reactions  . Naproxen Other (See Comments)  . Acetaminophen Other (See Comments)    dizziness  . Ace Inhibitors Cough    Patient Active Problem List   Diagnosis Date Noted  . History of uterine fibroid 05/29/2017  . Menopause 05/29/2017  . Diverticulosis 02/18/2016  . HLD (hyperlipidemia) 06/25/2015  . Prediabetes 06/25/2015  . Hypertension 04/17/2012    Past Medical History:  Diagnosis Date  . Diverticulitis   . Fibroids   . GERD (gastroesophageal reflux disease)   . High cholesterol   . Hyperlipidemia   . Hypertension      Past Surgical History:  Procedure Laterality Date  . HERNIA REPAIR    . TUBAL LIGATION      Social History   Socioeconomic History  . Marital status: Married    Spouse name: Not on file  . Number of children: Not on file  . Years of education: Not on file  . Highest education level: Not on file  Occupational History  . Not on file  Tobacco Use  . Smoking status: Never Smoker  . Smokeless tobacco: Never Used  Vaping Use  . Vaping Use: Never used  Substance and Sexual Activity  . Alcohol use: No  . Drug use: No  . Sexual activity: Yes    Partners: Male    Birth control/protection: Surgical    Comment: 1st intercourse- 17, partners- 2, married- 58 yrs   Other Topics Concern  . Not on file  Social History Narrative   ** Merged History Encounter **       ** Merged History Encounter **       Social Determinants of Health   Financial Resource Strain: Not on file  Food Insecurity: Not on file  Transportation Needs: Not on file  Physical Activity: Not on file  Stress: Not on file  Social Connections: Not on file  Intimate Partner Violence: Not on file    Family History  Problem Relation Age of Onset  . Stomach cancer Mother   .  Pneumonia Father   . Diabetes Sister   . Diabetes Brother   . Hypertension Sister   . Hyperlipidemia Sister   . Hypertension Sister   . Liver disease Neg Hx   . Pancreatic cancer Neg Hx   . Esophageal cancer Neg Hx   . Colon cancer Neg Hx      Review of Systems  Constitutional: Negative.  Negative for chills and fever.  HENT: Negative.  Negative for congestion and sore throat.   Respiratory: Negative.  Negative for cough and shortness of breath.   Cardiovascular: Negative.  Negative for chest pain and palpitations.  Gastrointestinal: Negative.  Negative for abdominal pain, diarrhea, nausea and vomiting.  Genitourinary: Negative.  Negative for dysuria and hematuria.  Skin: Negative.  Negative for rash.  Neurological: Negative.   Negative for dizziness and headaches.  All other systems reviewed and are negative.  Today's Vitals   04/04/21 1559  BP: 138/88  Pulse: 94  Temp: 99.7 F (37.6 C)  TempSrc: Oral  SpO2: 99%  Weight: 179 lb 12.8 oz (81.6 kg)  Height: 5' 0.24" (1.53 m)   Body mass index is 34.84 kg/m.   Physical Exam Vitals reviewed.  Constitutional:      Appearance: Normal appearance.  HENT:     Head: Normocephalic.     Mouth/Throat:     Mouth: Mucous membranes are moist.     Pharynx: Oropharynx is clear.  Eyes:     Extraocular Movements: Extraocular movements intact.     Conjunctiva/sclera: Conjunctivae normal.     Pupils: Pupils are equal, round, and reactive to light.  Cardiovascular:     Rate and Rhythm: Normal rate and regular rhythm.     Pulses: Normal pulses.     Heart sounds: Normal heart sounds.  Pulmonary:     Effort: Pulmonary effort is normal.     Breath sounds: Normal breath sounds.  Musculoskeletal:        General: Normal range of motion.     Cervical back: Normal range of motion and neck supple.  Skin:    General: Skin is warm and dry.     Capillary Refill: Capillary refill takes less than 2 seconds.  Neurological:     General: No focal deficit present.     Mental Status: She is alert and oriented to person, place, and time.  Psychiatric:        Mood and Affect: Mood normal.        Behavior: Behavior normal.      ASSESSMENT & PLAN: Hypertension Well-controlled hypertension.  Continue amlodipine 5 mg daily.  Diet and nutrition discussed.  Follow-up in 6 months.  HLD (hyperlipidemia) Diet and nutrition discussed.  Continue atorvastatin 20 mg daily.  Ollie was seen today for hypertension.  Diagnoses and all orders for this visit:  Primary hypertension  Prediabetes  Hyperlipidemia, unspecified hyperlipidemia type    Patient Instructions   Mantenimiento de la salud en [place] Maintenance, Female Adoptar un estilo de vida saludable y  recibir atencin preventiva son importantes para promover la salud y Musician. Consulte al mdico sobre:  El esquema adecuado para hacerse pruebas y exmenes peridicos.  Cosas que puede hacer por su cuenta para prevenir enfermedades y [employer]. Qu debo saber sobre la dieta, el peso y el ejercicio? Consuma una dieta saludable  Consuma una dieta que incluya muchas verduras, frutas, productos lcteos con bajo contenido de [place] y Advertising account planner.  No consuma muchos alimentos ricos en grasas slidas,  azcares agregados o sodio.   Mantenga un peso saludable El ndice de masa muscular [facility]) se [place] para identificar problemas de [place]. Proporciona una estimacin de la grasa corporal basndose en el peso y la altura. Su mdico puede ayudarle a Radiation protection practitioner [place] y a Scientist, forensic o Theatre manager un peso saludable. Haga ejercicio con regularidad Haga ejercicio con regularidad. Esta es una de las prcticas ms importantes que puede hacer por su salud. La mayora de los adultos deben seguir estas pautas:  Optometrist, al menos, 148minutos de actividad fsica por semana. El ejercicio debe aumentar la frecuencia cardaca y Nature conservation officer transpirar (ejercicio de intensidad moderada).  Hacer ejercicios de fortalecimiento por lo [employer] por semana. Agregue esto a su plan de ejercicio de intensidad moderada.  Pasar menos tiempo sentados. Incluso la actividad fsica ligera puede ser beneficiosa. Controle sus niveles de colesterol y lpidos en la sangre Comience a realizarse anlisis de lpidos y Research officer, trade union en la sangre a los 20aos y luego reptalos cada 5aos. Hgase controlar los niveles de colesterol con mayor frecuencia si:  Sus niveles de lpidos y colesterol son altos.  Es mayor de 40aos.  Presenta un alto riesgo de padecer enfermedades cardacas. Qu debo saber sobre las pruebas de deteccin del cncer? Segn su historia clnica y sus antecedentes familiares, es posible que deba  realizarse pruebas de deteccin del cncer en diferentes edades. Esto puede incluir pruebas de deteccin de lo siguiente:  Cncer de mama.  Cncer de cuello uterino.  Cncer colorrectal.  Cncer de piel.  Cncer de pulmn. Qu debo saber sobre la enfermedad cardaca, la diabetes y la hipertensin arterial? Presin arterial y enfermedad cardaca  La hipertensin arterial causa enfermedades cardacas y [place] el riesgo de accidente cerebrovascular. Es ms probable que esto se manifieste en las personas que tienen lecturas de presin arterial alta, tienen ascendencia africana o tienen sobrepeso.  Hgase controlar la presin arterial: ? Cada 3 a 5 aos si tiene entre 18 y 63 aos. ? Todos los aos si es mayor de [place]. Diabetes Realcese exmenes de deteccin de la diabetes con regularidad. Este anlisis revisa el nivel de azcar en la sangre en [place]. Hgase las pruebas de deteccin:  Cada tresaos despus de los 48aos de edad si tiene un peso normal y un bajo riesgo de padecer diabetes.  Con ms frecuencia y a partir de [place] edad inferior si tiene sobrepeso o un alto riesgo de padecer diabetes. Qu debo saber sobre la prevencin de infecciones? Hepatitis B Si tiene un riesgo ms alto de contraer hepatitis B, debe someterse a un examen de deteccin de este virus. Hable con el mdico para averiguar si tiene riesgo de contraer la infeccin por hepatitis B. Hepatitis C Se recomienda el anlisis a:  [employer] 1945 y 1965.  Todas las personas que tengan un riesgo de haber contrado hepatitis C. Enfermedades de transmisin sexual (ETS)  Hgase las pruebas de Programme researcher, broadcasting/film/video de ITS, incluidas la gonorrea y la clamidia, si: ? Es sexualmente activa y es menor de [place]. ? Es mayor de 24aos, y Investment banker, operational informa que corre riesgo de tener este tipo de infecciones. ? La actividad sexual ha cambiado desde que le hicieron la ltima prueba de deteccin y tiene un riesgo  mayor de Best boy clamidia o Radio broadcast assistant. Pregntele al mdico si usted tiene riesgo.  Pregntele al mdico si usted tiene un alto riesgo de Museum/gallery curator VIH. El mdico tambin puede recomendarle un medicamento recetado para ayudar a Mining engineer  infeccin por el VIH. Si elige tomar medicamentos para prevenir el VIH, primero debe [employer] de deteccin del VIH. Luego debe hacerse anlisis cada 34meses mientras est tomando los medicamentos. Embarazo  Si est por dejar de Librarian, academic (fase premenopusica) y usted puede quedar [place], busque asesoramiento antes de [place].  Tome de 400 a [id number] (mcg) de cido [employer] si [place].  Pida mtodos de control de la natalidad (anticonceptivos) si desea evitar un embarazo no deseado. Osteoporosis y [place] La osteoporosis es una enfermedad en la que los huesos pierden los minerales y la fuerza por el avance de la edad. El resultado pueden ser fracturas en los [place]. Si tiene 65aos o ms, o si est en riesgo de sufrir osteoporosis y fracturas, pregunte a su mdico si debe:  Hacerse pruebas de deteccin de prdida sea.  Tomar un suplemento de calcio o de vitamina D para reducir el riesgo de fracturas.  Recibir terapia de reemplazo hormonal (TRH) para tratar los sntomas de la menopausia. Siga estas instrucciones en su casa: Estilo de vida  No consuma ningn producto que contenga nicotina o tabaco, como cigarrillos, cigarrillos electrnicos y tabaco de Higher education careers adviser. Si necesita ayuda para dejar de fumar, consulte al mdico.  No consuma drogas.  No comparta agujas.  Solicite ayuda a su mdico si necesita apoyo o informacin para abandonar las drogas. Consumo de alcohol  No beba alcohol si: ? Su mdico le indica no hacerlo. ? Est embarazada, puede estar embarazada o est tratando de quedar embarazada.  Si bebe alcohol: ? Limite la cantidad que consume de 0 a 1 medida por da. ? Limite la ingesta si  est amamantando.  Est atento a la cantidad de alcohol que hay en las bebidas que toma. En los [place], una medida equivale a una botella de cerveza de 12oz (37ml), un vaso de vino de 5oz (128ml) o un vaso de una bebida alcohlica de alta graduacin de 1oz (40ml). Instrucciones generales  Realcese los estudios de rutina de la salud, dentales y de Public librarian.  [place].  Infrmele a su mdico si: ? Se siente deprimida con frecuencia. ? Alguna vez ha sido vctima de [place] o no se siente segura en su casa. Resumen  Adoptar un estilo de vida saludable y recibir atencin preventiva son importantes para promover la salud y Musician.  Siga las instrucciones del mdico acerca de una dieta saludable, el ejercicio y la realizacin de pruebas o exmenes para Engineer, building services.  Siga las instrucciones del mdico con respecto al control del colesterol y la presin arterial. Esta informacin no tiene Marine scientist el consejo del mdico. Asegrese de hacerle al mdico cualquier pregunta que tenga. Document Revised: 12/11/2018 Document Reviewed: 12/11/2018 Elsevier Patient Education  2021 [place], MD [facility]

## 2021-04-04 NOTE — Assessment & Plan Note (Signed)
Well-controlled hypertension.  Continue amlodipine 5 mg daily. Diet and nutrition discussed. Follow-up in 6 months. 

## 2021-04-04 NOTE — Assessment & Plan Note (Signed)
Diet and nutrition discussed.  Continue atorvastatin 20 mg daily. 

## 2021-04-04 NOTE — Patient Instructions (Signed)
Mantenimiento de Technical sales engineer en [place] Maintenance, Female Adoptar un estilo de vida saludable y recibir atencin preventiva son importantes para promover la salud y Musician. Consulte al mdico sobre:  El esquema adecuado para hacerse pruebas y exmenes peridicos.  Cosas que puede hacer por su cuenta para prevenir enfermedades y [employer]. Qu debo saber sobre la dieta, el peso y el ejercicio? Consuma una dieta saludable  Consuma una dieta que incluya muchas verduras, frutas, productos lcteos con bajo contenido de [place] y Advertising account planner.  No consuma muchos alimentos ricos en grasas slidas, azcares agregados o sodio.   Mantenga un peso saludable El ndice de masa muscular [facility]) se [place] and [place] para identificar problemas de [place]. Proporciona una estimacin de la grasa corporal basndose en el peso y la altura. Su mdico puede ayudarle a Radiation protection practitioner [place] y a Scientist, forensic o Theatre manager un peso saludable. Haga ejercicio con regularidad Haga ejercicio con regularidad. Esta es una de las prcticas ms importantes que puede hacer por su salud. La mayora de los adultos deben seguir estas pautas:  Optometrist, al menos, 133minutos de actividad fsica por semana. El ejercicio debe aumentar la frecuencia cardaca y Nature conservation officer transpirar (ejercicio de intensidad moderada).  Hacer ejercicios de fortalecimiento por lo [employer] por semana. Agregue esto a su plan de ejercicio de intensidad moderada.  Pasar menos tiempo sentados. Incluso la actividad fsica ligera puede ser beneficiosa. Controle sus niveles de colesterol y lpidos en la sangre Comience a realizarse anlisis de lpidos y Research officer, trade union en la sangre a los 20aos y luego reptalos cada 5aos. Hgase controlar los niveles de colesterol con mayor frecuencia si:  Sus niveles de lpidos y colesterol son altos.  Es mayor de 40aos.  Presenta un alto riesgo de padecer enfermedades cardacas. Qu debo saber sobre las pruebas de  deteccin del cncer? Segn su historia clnica y sus antecedentes familiares, es posible que deba realizarse pruebas de deteccin del cncer en diferentes edades. Esto puede incluir pruebas de deteccin de lo siguiente:  Cncer de mama.  Cncer de cuello uterino.  Cncer colorrectal.  Cncer de piel.  Cncer de pulmn. Qu debo saber sobre la enfermedad cardaca, la diabetes y la hipertensin arterial? Presin arterial y enfermedad cardaca  La hipertensin arterial causa enfermedades cardacas y [place] el riesgo de accidente cerebrovascular. Es ms probable que esto se manifieste en las personas que tienen lecturas de presin arterial alta, tienen ascendencia africana o tienen sobrepeso.  Hgase controlar la presin arterial: ? Cada 3 a 5 aos si tiene entre 18 y 63 aos. ? Todos los aos si es mayor de [place]. Diabetes Realcese exmenes de deteccin de la diabetes con regularidad. Este anlisis revisa el nivel de azcar en la sangre en [place]. Hgase las pruebas de deteccin:  Cada tresaos despus de los 52aos de edad si tiene un peso normal y un bajo riesgo de padecer diabetes.  Con ms frecuencia y a partir de [place] edad inferior si tiene sobrepeso o un alto riesgo de padecer diabetes. Qu debo saber sobre la prevencin de infecciones? Hepatitis B Si tiene un riesgo ms alto de contraer hepatitis B, debe someterse a un examen de deteccin de este virus. Hable con el mdico para averiguar si tiene riesgo de contraer la infeccin por hepatitis B. Hepatitis C Se recomienda el anlisis a:  [employer] 1945 y 1965.  Todas las personas que tengan un riesgo de haber contrado hepatitis C. Enfermedades de transmisin sexual (ETS)  Hgase  las pruebas de deteccin de ITS, incluidas la gonorrea y la clamidia, si: ? Es sexualmente activa y es menor de [place]. ? Es mayor de 24aos, y Investment banker, operational informa que corre riesgo de tener este tipo de  infecciones. ? La actividad sexual ha cambiado desde que le hicieron la ltima prueba de deteccin y tiene un riesgo mayor de Best boy clamidia o Radio broadcast assistant. Pregntele al mdico si usted tiene riesgo.  Pregntele al mdico si usted tiene un alto riesgo de Museum/gallery curator VIH. El mdico tambin puede recomendarle un medicamento recetado para ayudar a evitar la infeccin por el VIH. Si elige tomar medicamentos para prevenir el VIH, primero debe [employer] de deteccin del VIH. Luego debe hacerse anlisis cada 6meses mientras est tomando los medicamentos. Embarazo  Si est por dejar de Librarian, academic (fase premenopusica) y usted puede quedar [place], busque asesoramiento antes de [place].  Tome de 400 a [id number] (mcg) de cido [employer] si [place].  Pida mtodos de control de la natalidad (anticonceptivos) si desea evitar un embarazo no deseado. Osteoporosis y [place] La osteoporosis es una enfermedad en la que los huesos pierden los minerales y la fuerza por el avance de la edad. El resultado pueden ser fracturas en los [place]. Si tiene 65aos o ms, o si est en riesgo de sufrir osteoporosis y fracturas, pregunte a su mdico si debe:  Hacerse pruebas de deteccin de prdida sea.  Tomar un suplemento de calcio o de vitamina D para reducir el riesgo de fracturas.  Recibir terapia de reemplazo hormonal (TRH) para tratar los sntomas de la menopausia. Siga estas instrucciones en su casa: Estilo de vida  No consuma ningn producto que contenga nicotina o tabaco, como cigarrillos, cigarrillos electrnicos y tabaco de Higher education careers adviser. Si necesita ayuda para dejar de fumar, consulte al mdico.  No consuma drogas.  No comparta agujas.  Solicite ayuda a su mdico si necesita apoyo o informacin para abandonar las drogas. Consumo de alcohol  No beba alcohol si: ? Su mdico le indica no hacerlo. ? Est embarazada, puede estar embarazada o est tratando de quedar  embarazada.  Si bebe alcohol: ? Limite la cantidad que consume de 0 a 1 medida por da. ? Limite la ingesta si est amamantando.  Est atento a la cantidad de alcohol que hay en las bebidas que toma. En los [place], una medida equivale a una botella de cerveza de 12oz (362ml), un vaso de vino de 5oz (175ml) o un vaso de una bebida alcohlica de alta graduacin de 1oz (56ml). Instrucciones generales  Realcese los estudios de rutina de la salud, dentales y de Public librarian.  [place].  Infrmele a su mdico si: ? Se siente deprimida con frecuencia. ? Alguna vez ha sido vctima de [place] o no se siente segura en su casa. Resumen  Adoptar un estilo de vida saludable y recibir atencin preventiva son importantes para promover la salud y Musician.  Siga las instrucciones del mdico acerca de una dieta saludable, el ejercicio y la realizacin de pruebas o exmenes para Engineer, building services.  Siga las instrucciones del mdico con respecto al control del colesterol y la presin arterial. Esta informacin no tiene Marine scientist el consejo del mdico. Asegrese de hacerle al mdico cualquier pregunta que tenga. Document Revised: 12/11/2018 Document Reviewed: 12/11/2018 Elsevier Patient Education  [place].

## 2021-04-22 ENCOUNTER — Other Ambulatory Visit (HOSPITAL_COMMUNITY)
Admission: RE | Admit: 2021-04-22 | Discharge: 2021-04-22 | Disposition: A | Discharge status: Home / Self Care | Payer: Self-pay | Source: Ambulatory Visit | Attending: Obstetrics & Gynecology | Admitting: Obstetrics & Gynecology

## 2021-04-22 ENCOUNTER — Other Ambulatory Visit: Payer: Self-pay

## 2021-04-22 ENCOUNTER — Encounter: Payer: Self-pay | Admitting: Obstetrics & Gynecology

## 2021-04-22 ENCOUNTER — Ambulatory Visit (INDEPENDENT_AMBULATORY_CARE_PROVIDER_SITE_OTHER): Payer: Self-pay | Admitting: Obstetrics & Gynecology

## 2021-04-22 VITALS — BP 132/84 | Ht 60.0 in | Wt 177.0 lb

## 2021-04-22 DIAGNOSIS — Z6833 Body mass index (BMI) 33.0-33.9, adult: Secondary | ICD-10-CM

## 2021-04-22 DIAGNOSIS — R1032 Left lower quadrant pain: Secondary | ICD-10-CM

## 2021-04-22 DIAGNOSIS — Z01419 Encounter for gynecological examination (general) (routine) without abnormal findings: Secondary | ICD-10-CM

## 2021-04-22 DIAGNOSIS — E6609 Other obesity due to excess calories: Secondary | ICD-10-CM

## 2021-04-22 DIAGNOSIS — Z780 Asymptomatic menopausal state: Secondary | ICD-10-CM

## 2021-04-22 NOTE — Progress Notes (Signed)
Sheila [name] July 25, 1961 [id number]   History:    60 y.o. [id number] Married.  S/P TL.  RP:  Established patient presenting for annual gyn exam   HPI: Postmenopause, well on no HRT.  No PMB.  Mild left lower abdominal pain intermittently x a few months.  No fever.  H/O Diverticulosis.  Urine/BMs normal.  Breasts normal.  Will schedule screening mammo.  BMI 34.57.  Health Labs with Fam MD.  Sheila [name] 10/2020, Polyps removed.  Past medical history,surgical history, family history and social history were all reviewed and documented in the EPIC chart.  Gynecologic History Patient's last menstrual period was 05/08/2017 (approximate).  Obstetric History OB History  Gravida Para Term Preterm AB Living  5 3 0 0 2 3  SAB IAB Ectopic Multiple Live Births  2 0 0        # Outcome Date GA Lbr Len/2nd Weight Sex Delivery Anes PTL Lv  5 SAB           4 SAB           3 Para           2 Para           1 Para              ROS: A ROS was performed and pertinent positives and negatives are included in the history.  GENERAL: No fevers or chills. HEENT: No change in vision, no earache, sore throat or sinus congestion. NECK: No pain or stiffness. CARDIOVASCULAR: No chest pain or pressure. No palpitations. PULMONARY: No shortness of breath, cough or wheeze. GASTROINTESTINAL: No abdominal pain, nausea, vomiting or diarrhea, melena or bright red blood per rectum. GENITOURINARY: No urinary frequency, urgency, hesitancy or dysuria. MUSCULOSKELETAL: No joint or muscle pain, no back pain, no recent trauma. DERMATOLOGIC: No rash, no itching, no lesions. ENDOCRINE: No polyuria, polydipsia, no heat or cold intolerance. No recent change in weight. HEMATOLOGICAL: No anemia or easy bruising or bleeding. NEUROLOGIC: No headache, seizures, numbness, tingling or weakness. PSYCHIATRIC: No depression, no loss of interest in normal activity or change in sleep pattern.     Exam:   BP 132/84   Ht 5' (1.524  m)   Wt 177 lb (80.3 kg)   LMP 05/08/2017 (Approximate)   BMI 34.57 kg/m   Body mass index is 34.57 kg/m.  General appearance : Well developed well nourished female. No acute distress HEENT: Eyes: no retinal hemorrhage or exudates,  Neck supple, trachea midline, no carotid bruits, no thyroidmegaly Lungs: Clear to auscultation, no rhonchi or wheezes, or rib retractions  Heart: Regular rate and rhythm, no murmurs or gallops Breast:Examined in sitting and supine position were symmetrical in appearance, no palpable masses or tenderness,  no skin retraction, no nipple inversion, no nipple discharge, no skin discoloration, no axillary or supraclavicular lymphadenopathy Abdomen: no palpable masses or tenderness, no rebound or guarding Extremities: no edema or skin discoloration or tenderness  Pelvic: Vulva: Normal             Vagina: No gross lesions or discharge  Cervix: No gross lesions or discharge.  Pap reflex done.  Uterus  AV, normal size, shape and consistency, non-tender and mobile  Adnexa  Without masses or tenderness  Anus: Normal   Assessment/Plan:  60 y.o. female for annual exam   1. Encounter for routine gynecological examination with Papanicolaou smear of cervix Normal gynecologic exam.  Pap reflex done.  Breast exam normal.  Patient will schedule  a screening mammogram now.  Colonoscopy November 2021, polyps removed.  Health labs with family physician.  2. Postmenopause Well on no hormone replacement therapy.  No postmenopausal bleeding.  Vitamin D supplements, calcium intake of 1.5 g/day total and regular weightbearing physical activities recommended.  3. Left lower quadrant pain Intermittent left lower quadrant pain.  History of diverticulosis.  Probably gastrointestinal in origin.  Will rule out left ovarian mass or cyst.  Follow-up pelvic ultrasound. - US Transvaginal Non-OB; Future  4. Class 1 obesity due to excess calories without serious comorbidity with body mass  index (BMI) of 33.0 to 33.9 in adult Recommend a lower calorie/carb diet.  Aerobic activities 5 times a week and light weightlifting every 2 days.  Princess Bruins MD, 12:16 PM 04/22/2021

## 2021-04-22 NOTE — Addendum Note (Signed)
Addended by: Princess Bruins on: 04/22/2021 01:47 PM   Modules accepted: Orders

## 2021-04-28 LAB — CYTOLOGY - PAP: Diagnosis: NEGATIVE

## 2021-05-03 NOTE — Progress Notes (Signed)
Patient informed. 

## 2021-05-04 ENCOUNTER — Ambulatory Visit: Payer: Self-pay | Admitting: Emergency Medicine

## 2021-05-19 ENCOUNTER — Other Ambulatory Visit: Payer: Self-pay

## 2021-05-19 ENCOUNTER — Other Ambulatory Visit: Payer: Self-pay | Admitting: Obstetrics & Gynecology

## 2021-05-19 DIAGNOSIS — Z0289 Encounter for other administrative examinations: Secondary | ICD-10-CM

## 2021-06-22 ENCOUNTER — Other Ambulatory Visit: Payer: Self-pay | Admitting: *Deleted

## 2021-06-22 DIAGNOSIS — Z1231 Encounter for screening mammogram for malignant neoplasm of breast: Secondary | ICD-10-CM

## 2021-06-28 ENCOUNTER — Other Ambulatory Visit: Payer: Self-pay | Admitting: Obstetrics & Gynecology

## 2021-06-28 ENCOUNTER — Other Ambulatory Visit: Payer: Self-pay

## 2021-07-28 ENCOUNTER — Ambulatory Visit: Payer: Self-pay

## 2021-09-08 ENCOUNTER — Encounter: Payer: Self-pay | Admitting: Emergency Medicine

## 2021-09-08 ENCOUNTER — Ambulatory Visit (INDEPENDENT_AMBULATORY_CARE_PROVIDER_SITE_OTHER): Payer: Self-pay | Admitting: Emergency Medicine

## 2021-09-08 ENCOUNTER — Other Ambulatory Visit: Payer: Self-pay

## 2021-09-08 VITALS — BP 128/86 | HR 101 | Temp 98.9°F | Ht 60.0 in | Wt 179.0 lb

## 2021-09-08 DIAGNOSIS — R0981 Nasal congestion: Secondary | ICD-10-CM

## 2021-09-08 DIAGNOSIS — I10 Essential (primary) hypertension: Secondary | ICD-10-CM

## 2021-09-08 DIAGNOSIS — E785 Hyperlipidemia, unspecified: Secondary | ICD-10-CM

## 2021-09-08 DIAGNOSIS — R7303 Prediabetes: Secondary | ICD-10-CM

## 2021-09-08 MED ORDER — ATORVASTATIN CALCIUM 20 MG PO TABS: 20.0000 mg | ORAL_TABLET | Freq: Every day | ORAL | 3 refills | Status: DC

## 2021-09-08 MED ORDER — AMLODIPINE BESYLATE 5 MG PO TABS: 5.0000 mg | ORAL_TABLET | Freq: Every day | ORAL | 1 refills | Status: DC

## 2021-09-08 NOTE — Assessment & Plan Note (Signed)
Well-controlled hypertension. Continue amlodipine 5 mg daily. 

## 2021-09-08 NOTE — Assessment & Plan Note (Signed)
Diet and nutrition discussed.  Continue atorvastatin 20 mg daily. The 10-year ASCVD risk score (Arnett DK, et al., 2019) is: 10.5%   Values used to calculate the score:     Age: 60 years     Sex: Female     Is Non-Hispanic African American: No     Diabetic: Yes     Tobacco smoker: No     Systolic Blood Pressure: 460 mmHg     Is BP treated: Yes     HDL Cholesterol: 57 mg/dL     Total Cholesterol: 247 mg/dL

## 2021-09-08 NOTE — Assessment & Plan Note (Signed)
Chronic sinus congestion leading to left infraorbital edema. No obvious acute ophthalmologic problem.

## 2021-09-08 NOTE — Patient Instructions (Signed)
Hipertensi?n en los adultos ?Hypertension, Adult ?El t?rmino hipertensi?n es otra forma de denominar a la presi?n arterial elevada. La presi?n arterial elevada fuerza al coraz?n a trabajar m?s para bombear la sangre. Esto puede causar problemas con el paso del tiempo. ?Una lectura de presi?n arterial est? compuesta por 2 n?meros. Hay un n?mero superior (sist?lico) sobre un n?mero inferior (diast?lico). Lo ideal es tener la presi?n arterial por debajo de 120/80. Las elecciones saludables pueden ayudar a bajar la presi?n arterial, o tal vez necesite medicamentos para bajarla. ??Cu?les son las causas? ?Se desconoce la causa de esta afecci?n. Algunas afecciones pueden estar relacionadas con la presi?n arterial alta. ??Qu? incrementa el riesgo? ?Fumar. ?Tener diabetes mellitus tipo 2, colesterol alto, o ambos. ?No hacer la cantidad suficiente de actividad f?sica o ejercicio. ?Tener sobrepeso. ?Consumir mucha grasa, az?car, calor?as o sal (sodio) en su dieta. ?Beber alcohol en exceso. ?Tener una enfermedad renal a largo plazo (cr?nica). ?Tener antecedentes familiares de presi?n arterial alta. ?Edad. Los riesgos aumentan con la edad. ?Raza. El riesgo es mayor para las personas afroamericanas. ?Sexo. Antes de los 45 a?os, los hombres corren m?s riesgo que las mujeres. Despu?s de los 65 a?os, las mujeres corren m?s riesgo que los hombres. ?Tener apnea obstructiva del sue?o. ?Estr?s. ??Cu?les son los signos o los s?ntomas? ?Es posible que la presi?n arterial alta puede no cause s?ntomas. La presi?n arterial muy alta (crisis hipertensiva) puede provocar: ?Dolor de cabeza. ?Sensaciones de preocupaci?n o nerviosismo (ansiedad). ?Falta de aire. ?Hemorragia nasal. ?Sensaci?n de malestar en el est?mago (n?useas). ?V?mitos. ?Cambios en la forma de ver. ?Dolor muy intenso en el pecho. ?Convulsiones. ??C?mo se trata? ?Esta afecci?n se trata haciendo cambios saludables en el estilo de vida, por ejemplo: ?Consumir alimentos  saludables. ?Hacer m?s ejercicio. ?Beber menos alcohol. ?El m?dico puede recetarle medicamentos si los cambios en el estilo de vida no son suficientes para lograr controlar la presi?n arterial y si: ?El n?mero de arriba est? por encima de 130. ?El n?mero de abajo est? por encima de 80. ?Su presi?n arterial personal ideal puede variar. ?Siga estas instrucciones en su casa: ?Comida y bebida ? ?Si se lo dicen, siga el plan de alimentaci?n de DASH (Dietary Approaches to Stop Hypertension, Maneras de alimentarse para detener la hipertensi?n). Para seguir este plan: ?Llene la mitad del plato de cada comida con frutas y verduras. ?Llene un cuarto del plato de cada comida con cereales integrales. Los cereales integrales incluyen pasta integral, arroz integral y pan integral. ?Coma y beba productos l?cteos con bajo contenido de grasa, como leche descremada o yogur bajo en grasas. ?Llene un cuarto del plato de cada comida con prote?nas bajas en grasa (magras). Las prote?nas bajas en grasa incluyen pescado, pollo sin piel, huevos, frijoles y tofu. ?Evite consumir carne grasa, carne curada y procesada, o pollo con piel. ?Evite consumir alimentos prehechos o procesados. ?Consuma menos de 1500 mg de sal por d?a. ?No beba alcohol si: ?El m?dico le indica que no lo haga. ?Est? embarazada, puede estar embarazada o est? tratando de quedar embarazada. ?Si bebe alcohol: ?Limite la cantidad que bebe a lo siguiente: ?De 0 a 1 medida por d?a para las mujeres. ?De 0 a 2 medidas por d?a para los hombres. ?Est? atento a la cantidad de alcohol que hay en las bebidas que toma. En [place], una medida equivale a una botella de cerveza de 12 oz (355 ml), un vaso de vino de 5 oz (148 ml) o un vaso de una bebida alcoh?lica de   alta graduaci?n de 1? oz (44 ml). ?Estilo de vida ? ?Trabaje con su m?dico para mantenerse en un peso saludable o para perder peso. Preg?ntele a su m?dico cu?l es el peso recomendable para usted. ?Haga al menos 30  minutos de ejercicio la mayor?a de los d?as de la semana. Estos pueden incluir caminar, nadar o andar en bicicleta. ?Realice al menos 30 minutos de ejercicio que fortalezca sus m?sculos (ejercicios de resistencia) al menos 3 d?as a la semana. Estos pueden incluir levantar pesas o hacer Pilates. ?No consuma ning?n producto que contenga nicotina o tabaco, como cigarrillos, cigarrillos electr?nicos y tabaco de mascar. Si necesita ayuda para dejar de fumar, consulte al m?dico. ?Controle su presi?n arterial en su casa tal como le indic? el m?dico. ?Concurra a todas las visitas de seguimiento como se lo haya indicado el m?dico. Esto es importante. ?Medicamentos ?Tome los medicamentos de venta libre y los recetados solamente como se lo haya indicado el m?dico. Siga cuidadosamente las indicaciones. ?No omita las dosis de medicamentos para la presi?n arterial. Los medicamentos pierden eficacia si omite dosis. El hecho de omitir las dosis tambi?n aumenta el riesgo de otros problemas. ?Preg?ntele a su m?dico a qu? efectos secundarios o reacciones a los medicamentos debe prestar atenci?n. ?Comun?quese con un m?dico si: ?Piensa que tiene una reacci?n a los medicamentos que est? tomando. ?Tiene dolores de cabeza frecuentes (recurrentes). ?Se siente mareado. ?Tiene hinchaz?n en los tobillos. ?Tiene problemas de visi?n. ?Solicite ayuda inmediatamente si: ?Siente un dolor de cabeza muy intenso. ?Empieza a sentirse desorientado (confundido). ?Se siente d?bil o adormecido. ?Siente que va a desmayarse. ?Tiene un dolor muy intenso en las siguientes zonas: ?Pecho. ?Vientre (abdomen). ?Vomita m?s de una vez. ?Tiene dificultad para respirar. ?Resumen ?El t?rmino hipertensi?n es otra forma de denominar a la presi?n arterial elevada. ?La presi?n arterial elevada fuerza al coraz?n a trabajar m?s para bombear la sangre. ?Para la mayor?a de las personas, una presi?n arterial normal es menor que 120/80. ?Las decisiones saludables pueden ayudarle  a disminuir su presi?n arterial. Si no puede bajar su presi?n arterial mediante decisiones saludables, es posible que deba tomar medicamentos. ?Esta informaci?n no tiene como fin reemplazar el consejo del m?dico. Aseg?rese de hacerle al m?dico cualquier pregunta que tenga. ?Document Revised: 09/05/2018 Document Reviewed: 09/05/2018 ?Elsevier Patient Education ? 2022 Elsevier Inc. ? ?

## 2021-09-08 NOTE — Progress Notes (Signed)
Sheila [name] 60 y.o.   Chief Complaint  Patient presents with   Hypertension    F/u. Also left eye pain, x 1 month.    HISTORY OF PRESENT ILLNESS: This is a 60 y.o. female complaining of mild discomfort to left eye area for 1 month. Recently seen by eye doctor.  Told she does not have any retinal problems. Also here for follow-up of hypertension.  Doing well. No other complaints or medical concerns today.  Hypertension Pertinent negatives include no blurred vision, chest pain, headaches, palpitations or shortness of breath.    Prior to Admission medications   Medication Sig Start Date End Date Taking? Authorizing Provider  amLODipine (NORVASC) 5 MG tablet Take 1 tablet (5 mg total) by mouth daily. 06/18/20  Yes Coral Timme, Ines Bloomer, MD  atorvastatin (LIPITOR) 20 MG tablet Take 1 tablet (20 mg total) by mouth daily. 11/03/20  Yes Danie Diehl, Ines Bloomer, MD  vitamin C (ASCORBIC ACID) 250 MG tablet Take 250 mg by mouth in the morning and at bedtime.   Yes [provider]  VITAMIN D, CHOLECALCIFEROL, PO Take 1 tablet by mouth as needed.   Yes [provider]  vitamin E 200 UNIT capsule Take 200 Units by mouth daily.   Yes [provider]  Zinc Sulfate (ZINC 15 PO) Take by mouth.   Yes [provider]    Allergies  Allergen Reactions   Naproxen Other (See Comments)   Acetaminophen Other (See Comments)    dizziness   Ace Inhibitors Cough    Patient Active Problem List   Diagnosis Date Noted   History of uterine fibroid 05/29/2017   Menopause 05/29/2017   Diverticulosis 02/18/2016   HLD (hyperlipidemia) 06/25/2015   Prediabetes 06/25/2015   Hypertension 04/17/2012    Past Medical History:  Diagnosis Date   Diverticulitis    Fibroids    GERD (gastroesophageal reflux disease)    High cholesterol    Hyperlipidemia    Hypertension     Past Surgical History:  Procedure Laterality Date   HERNIA REPAIR     TUBAL LIGATION       Social History   Socioeconomic History   Marital status: Married    Spouse name: Not on file   Number of children: Not on file   Years of education: Not on file   Highest education level: Not on file  Occupational History   Not on file  Tobacco Use   Smoking status: Never   Smokeless tobacco: Never  Vaping Use   Vaping Use: Never used  Substance and Sexual Activity   Alcohol use: No   Drug use: No   Sexual activity: Yes    Partners: Male    Birth control/protection: Surgical    Comment: 1st intercourse- 74, partners- 2, married- 76 yrs   Other Topics Concern   Not on file  Social History Narrative   ** Merged History Encounter **       ** Merged History Encounter **       Social Determinants of Radio broadcast assistant Strain: Not on file  Food Insecurity: Not on file  Transportation Needs: Not on file  Physical Activity: Not on file  Stress: Not on file  Social Connections: Not on file  Intimate Partner Violence: Not on file    Family History  Problem Relation Age of Onset   Stomach cancer Mother    Pneumonia Father    Diabetes Sister    Diabetes Brother  Hypertension Sister    Hyperlipidemia Sister    Hypertension Sister    Liver disease Neg Hx    Pancreatic cancer Neg Hx    Esophageal cancer Neg Hx    Colon cancer Neg Hx      Review of Systems  Constitutional: Negative.  Negative for chills and fever.  HENT:  Positive for congestion. Negative for sore throat.        Chronic sinus problems  Eyes:  Positive for pain. Negative for blurred vision, double vision, photophobia, discharge and redness.  Respiratory: Negative.  Negative for cough and shortness of breath.   Cardiovascular: Negative.  Negative for chest pain and palpitations.  Gastrointestinal:  Negative for abdominal pain, nausea and vomiting.  Skin: Negative.  Negative for rash.  Neurological: Negative.  Negative for dizziness, sensory change, speech change, focal weakness and  headaches.  All other systems reviewed and are negative.  Today's Vitals   09/08/21 1542  BP: 128/86  Pulse: (!) 101  Temp: 98.9 F (37.2 C)  TempSrc: Oral  SpO2: 97%  Weight: 179 lb (81.2 kg)  Height: 5' (1.524 m)   Body mass index is 34.96 kg/m. Wt Readings from Last 3 Encounters:  09/08/21 179 lb (81.2 kg)  04/22/21 177 lb (80.3 kg)  04/04/21 179 lb 12.8 oz (81.6 kg)    Physical Exam Vitals reviewed.  Constitutional:      Appearance: Normal appearance.  HENT:     Head: Normocephalic.  Eyes:     Extraocular Movements: Extraocular movements intact.     Conjunctiva/sclera: Conjunctivae normal.     Pupils: Pupils are equal, round, and reactive to light.     Comments: Mild left lateral subconjunctival hemorrhage.  Old and healing well. Mild left infra orbital edema  Cardiovascular:     Rate and Rhythm: Normal rate and regular rhythm.     Pulses: Normal pulses.     Heart sounds: Normal heart sounds.  Pulmonary:     Effort: Pulmonary effort is normal.     Breath sounds: Normal breath sounds.  Musculoskeletal:     Cervical back: Normal range of motion and neck supple. No tenderness.  Lymphadenopathy:     Cervical: No cervical adenopathy.  Skin:    General: Skin is warm and dry.     Capillary Refill: Capillary refill takes less than 2 seconds.  Neurological:     General: No focal deficit present.     Mental Status: She is alert and oriented to person, place, and time.  Psychiatric:        Mood and Affect: Mood normal.        Behavior: Behavior normal.     ASSESSMENT & PLAN: Problem List Items Addressed This Visit       Cardiovascular and Mediastinum   Primary hypertension    Well-controlled hypertension.  Continue amlodipine 5 mg daily.        Respiratory   Sinus congestion - Primary    Chronic sinus congestion leading to left infraorbital edema. No obvious acute ophthalmologic problem.        Other   HLD (hyperlipidemia)    Diet and nutrition  discussed.  Continue atorvastatin 20 mg daily. The 10-year ASCVD risk score (Arnett DK, et al., 2019) is: 10.5%   Values used to calculate the score:     Age: 63 years     Sex: Female     Is Non-Hispanic African American: No     Diabetic: Yes  Tobacco smoker: No     Systolic Blood Pressure: 818 mmHg     Is BP treated: Yes     HDL Cholesterol: 57 mg/dL     Total Cholesterol: 247 mg/dL       Prediabetes    Stable.  Diet and nutrition discussed.      Patient Instructions  Hipertensin en los adultos Hypertension, Adult El trmino hipertensin es otra forma de denominar a la presin arterial elevada. La presin arterial elevada fuerza al corazn a trabajar ms para bombear la sangre. Esto puede causar problemas con el paso del [place]. Una lectura de presin arterial est compuesta por 2 nmeros. Hay un nmero superior (sistlico) sobre un nmero inferior (diastlico). Lo ideal es tener la presin arterial por debajo de 120/80. Las elecciones saludables pueden ayudar a Engineer, materials presin arterial, o tal vez necesite medicamentos para bajarla. Cules son las causas? Se desconoce la causa de esta afeccin. Algunas afecciones pueden estar relacionadas con la presin arterial alta. Qu incrementa el riesgo? Fumar. Tener diabetes mellitus tipo 2, colesterol alto, o ambos. No hacer la cantidad suficiente de actividad fsica o ejercicio. Tener sobrepeso. Consumir mucha grasa, azcar, caloras o sal (sodio) en su dieta. Beber alcohol en exceso. Tener una enfermedad renal a largo plazo (crnica). Tener antecedentes familiares de presin arterial alta. Edad. Los riesgos aumentan con la edad. Raza. El riesgo es mayor para las Retail banker. Sexo. Antes de los 45 aos, los hombres corren ms [employer]. Despus de los 65 aos, las mujeres corren ms [employer]. Tener apnea obstructiva del sueo. Estrs. Cules son los signos o los sntomas? Es posible  que la presin arterial alta puede no cause sntomas. La presin arterial muy alta (crisis hipertensiva) puede provocar: Dolor de cabeza. Sensaciones de preocupacin o nerviosismo (ansiedad). Falta de aire. Hemorragia nasal. Sensacin de malestar en el estmago (nuseas). Vmitos. Cambios en la forma de ver. Dolor muy intenso en el pecho. Convulsiones. Cmo se trata? Esta afeccin se trata haciendo cambios saludables en el estilo de vida, por ejemplo: Consumir alimentos saludables. Hacer ms ejercicio. Beber menos alcohol. El mdico puede recetarle medicamentos si los cambios en el estilo de vida no son suficientes para Child psychotherapist la presin arterial y si: El nmero de arriba est por encima de 130. El nmero de abajo est por encima de 80. Su presin arterial personal ideal puede variar. Siga estas instrucciones en su casa: Comida y bebida  Si se lo dicen, siga el plan de alimentacin de DASH (Dietary Approaches to Stop Hypertension, Maneras de alimentarse para detener la hipertensin). Para seguir este plan: Llene la mitad del plato de cada comida con frutas y verduras. Llene un cuarto del plato de cada comida con cereales integrales. Los cereales integrales incluyen pasta integral, arroz integral y pan integral. Coma y beba productos lcteos con bajo contenido de grasa, como leche descremada o yogur bajo en grasas. Llene un cuarto del plato de cada comida con protenas bajas en grasa (magras). Las protenas bajas en grasa incluyen pescado, pollo sin piel, huevos, frijoles y tofu. Evite consumir carne grasa, carne curada y procesada, o pollo con piel. Evite consumir alimentos prehechos o procesados. Consuma menos de 1500 mg de sal por da. No beba alcohol si: El mdico le indica que no lo haga. Est embarazada, puede estar embarazada o est tratando de [place]. Si bebe alcohol: Limite la cantidad que bebe a lo siguiente: De 0 a 1 medida por da para  las  mujeres. De 0 a 2 medidas por da para los hombres. Est atento a la cantidad de alcohol que hay en las bebidas que toma. En los [place], una medida equivale a una botella de cerveza de 12 oz (355 ml), un vaso de vino de 5 oz (148 ml) o un vaso de una bebida alcohlica de alta graduacin de 1 oz (44 ml). Estilo de vida  Trabaje con su mdico para mantenerse en un peso saludable o para perder peso. Pregntele a su mdico cul es el peso recomendable para usted. Haga al menos 30 minutos de ejercicio la [employer] de la [place]. Estos pueden incluir caminar, nadar o andar en bicicleta. Realice al menos 30 minutos de ejercicio que fortalezca sus msculos (ejercicios de resistencia) al menos 3 das a la [place]. Estos pueden incluir levantar pesas o hacer Pilates. No consuma ningn producto que contenga nicotina o tabaco, como cigarrillos, cigarrillos electrnicos y tabaco de Higher education careers adviser. Si necesita ayuda para dejar de fumar, consulte al [employer]. Controle su presin arterial en su casa tal como le indic el mdico. Concurra a todas las visitas de seguimiento como se lo haya indicado el mdico. Esto es importante. Medicamentos [employer] de venta libre y los recetados solamente como se lo haya indicado el mdico. Siga cuidadosamente las indicaciones. No omita las dosis de medicamentos para la presin arterial. Los medicamentos pierden eficacia si omite dosis. El hecho de omitir las dosis tambin [place] el riesgo de otros problemas. Pregntele a su mdico a qu efectos secundarios o reacciones a los Careers information officer. Comunquese con un mdico si: Piensa que tiene [place] reaccin a los medicamentos que est tomando. Tiene dolores de cabeza frecuentes (recurrentes). Se siente mareado. Tiene hinchazn en los tobillos. Tiene problemas de visin. Solicite ayuda inmediatamente si: Siente un dolor de cabeza muy intenso. Empieza a sentirse desorientado (confundido). Se  siente dbil o adormecido. Siente que va a desmayarse. Tiene un dolor muy intenso en las siguientes zonas: Pecho. Vientre (abdomen). Vomita ms de una vez. Tiene dificultad para respirar. Resumen El trmino hipertensin es otra forma de denominar a la presin arterial elevada. La presin arterial elevada fuerza al corazn a trabajar ms para bombear la sangre. Para la [employer], una presin arterial normal es menor que 120/80. Las decisiones saludables pueden ayudarle a disminuir su presin arterial. Si no puede bajar su presin arterial mediante decisiones saludables, es posible que deba tomar medicamentos. Esta informacin no tiene Marine scientist el consejo del mdico. Asegrese de hacerle al mdico cualquier pregunta que tenga. Document Revised: 09/05/2018 Document Reviewed: 09/05/2018 Elsevier Patient Education  2022 [place], MD [facility]

## 2021-09-08 NOTE — Assessment & Plan Note (Signed)
Stable.  Diet and nutrition discussed. 

## 2021-10-04 ENCOUNTER — Ambulatory Visit: Payer: Self-pay | Admitting: Emergency Medicine

## 2022-03-16 ENCOUNTER — Other Ambulatory Visit: Payer: Self-pay | Admitting: Emergency Medicine

## 2022-03-16 DIAGNOSIS — I10 Essential (primary) hypertension: Secondary | ICD-10-CM

## 2022-04-06 ENCOUNTER — Encounter: Payer: Self-pay | Admitting: Emergency Medicine

## 2022-04-06 ENCOUNTER — Ambulatory Visit (INDEPENDENT_AMBULATORY_CARE_PROVIDER_SITE_OTHER): Payer: Self-pay | Admitting: Emergency Medicine

## 2022-04-06 VITALS — BP 116/74 | HR 93 | Temp 98.2°F | Ht 60.0 in | Wt 181.5 lb

## 2022-04-06 DIAGNOSIS — E785 Hyperlipidemia, unspecified: Secondary | ICD-10-CM

## 2022-04-06 DIAGNOSIS — R040 Epistaxis: Secondary | ICD-10-CM | POA: Insufficient documentation

## 2022-04-06 DIAGNOSIS — I10 Essential (primary) hypertension: Secondary | ICD-10-CM

## 2022-04-06 DIAGNOSIS — R7303 Prediabetes: Secondary | ICD-10-CM

## 2022-04-06 NOTE — Assessment & Plan Note (Signed)
Diet and nutrition discussed.  Hemoglobin A1c done today. ?Advised to decrease amount of daily carbohydrate intake and watch total daily caloric intake. ?

## 2022-04-06 NOTE — Patient Instructions (Signed)
Hemorragia nasal en los adultos ?Nosebleed, Adult ?Cuando hay hemorragia nasal, sale sangre de la [place]. Las hemorragias nasales son Sheila [name] afecci?n frecuente y pueden deberse a muchos factores. Por lo general, no indican un problema m?dico grave. ?Siga estas indicaciones en su casa: ?Si tiene una hemorragia nasal: ? ?Si?ntese. ?Incline la cabeza un poco hacia adelante. ?Siga estos pasos: ?Presione la nariz con una toalla o un pa?uelo de papel limpios. ?Contin?e presionando la nariz durante 5 minutos. No deje de hacerlo. ?Despu?s de 5 minutos, deje de presionar la nariz. ?Contin?e repitiendo [employer] que el sangrado se [place]. ?No coloque pa?uelos de papel ni otras cosas en la nariz para detener el sangrado. ?Evite acostarse o colocar la cabeza hacia atr?s. ?Use un aerosol nasal descongestivo seg?n lo indicado por su m?dico. ?Despu?s de una hemorragia nasal: ?Trate de no sonarse ni resoplarse la nariz durante varias horas. ?Trate de no hacer esfuerzos, levantar objetos ni doblar la cintura para agacharse durante varios d?as. ?La aspirina y los medicamentos que diluyen la sangre aumentan la probabilidad de sangrados. Si toma estos medicamentos: ?Pregunte a su m?dico si debe interrumpirlos o si debe cambiar la cantidad que toma. ?No deje de tomar los medicamentos excepto que el m?dico se lo haya indicado. ?Si la causa de la hemorragia fue la sequedad, use gel o aerosol nasal de soluci?n salina de venta libre y un humidificador seg?n se lo haya indicado el m?dico. Esto mantendr? el interior de la nariz h?meda y Nurse, children's? curarse. Si necesita usar gel o aerosol nasal: ?Elija uno que sea soluble en agua. ?Use solamente la cantidad que necesita y con la frecuencia necesaria. ?No se acueste inmediatamente despu?s de usarlo. ?Si tiene hemorragias nasales con frecuencia, hable con el m?dico sobre los tratamientos. Estos pueden incluir: ?Cauterizaci?n nasal. Se utiliza un hisopo con una sustancia qu?mica o un  dispositivo el?ctrico con el que se queman ligeramente los vasos sangu?neos diminutos que est?n dentro de la [place]. Esto ayuda a Diplomatic Services operational officer. ?Taponamiento nasal. Se coloca una gasa u otro material en la nariz para ejercer presi?n constante sobre la zona de la hemorragia. ?Comun?quese con un m?dico si: ?Tiene fiebre. ?Tiene hemorragias nasales con frecuencia. ?Tiene hemorragias nasales con m?s frecuencia de lo habitual. ?Presenta moretones con mucha facilidad. ?Tiene algo metido en la nariz. ?Tiene sangrado en la boca. ?Vomita o libera una sustancia marr?n al toser. ?Tiene una hemorragia nasal despu?s de comenzar un medicamento nuevo. ?Solicite ayuda de inmediato si: ?Tiene una hemorragia nasal despu?s de caerse o lastimarse la cabeza. ?Tiene una hemorragia nasal que no desaparece despu?s de 20 minutos. ?Se siente mareado o d?bil. ?Tiene hemorragias fuera de lo com?n en otras partes del cuerpo. ?Tiene moretones fuera de lo com?n en otras partes del cuerpo. ?Sheila [name]. ?Vomita sangre. ?Resumen ?Las hemorragias nasales son frecuentes. Por lo general, no indican un problema m?dico grave. ?Si tiene una hemorragia nasal, si?ntese e incline la cabeza un poco hacia adelante. Presione la nariz con un pa?uelo de papel limpio durante 5 minutos. ?Utilice un aerosol salino o un gel salino y un humidificador seg?n lo indicado por su m?dico. ?Busque ayuda de inmediato si la hemorragia nasal no desaparece despu?s de 20 minutos. ?Esta informaci?n no tiene Marine scientist el consejo del m?dico. Aseg?rese de hacerle al m?dico cualquier pregunta que tenga. ?Document Revised: 12/28/2021 Document Reviewed: 12/28/2021 ?Elsevier Patient Education ? [place]. ? ?

## 2022-04-06 NOTE — Progress Notes (Signed)
Sheila [name] 61 y.o. ? ? ?Chief Complaint  ?Patient presents with  ? Epistaxis  ?  Nose bleed yesterday. Had surgery on her nose in the past.   ? ? ?HISTORY OF PRESENT ILLNESS: ?This is a 61 y.o. female complaining of nosebleed yesterday.  No bleeding today.  Responded to local pressure.  Had severe episode of nosebleed about 4 years ago which needed cauterization. ?Not taking aspirin or any other anticoagulant.  Denies injury.  Denies pain. ?History of well-controlled hypertension on medication ?History of prediabetes ?History of dyslipidemia on medication ?No other complaints or medical concerns today. ? ?Epistaxis  ? ? ? ?Prior to Admission medications   ?Medication Sig Start Date End Date Taking? Authorizing Provider  ?amLODipine (NORVASC) 5 MG tablet Take 1 tablet by mouth once daily 03/16/22  Yes Keiarah Orlowski, Ines Bloomer, MD  ?atorvastatin (LIPITOR) 20 MG tablet Take 1 tablet (20 mg total) by mouth daily. 09/08/21  Yes Ammara Raj, Ines Bloomer, MD  ?vitamin C (ASCORBIC ACID) 250 MG tablet Take 250 mg by mouth in the morning and at bedtime.   Yes [provider]  ?VITAMIN D, CHOLECALCIFEROL, PO Take 1 tablet by mouth as needed.   Yes [provider]  ?vitamin E 200 UNIT capsule Take 200 Units by mouth daily.   Yes [provider]  ?Zinc Sulfate (ZINC 15 PO) Take by mouth.   Yes [provider]  ? ? ?Allergies  ?Allergen Reactions  ? Naproxen Other (See Comments)  ? Acetaminophen Other (See Comments)  ?  dizziness  ? Ace Inhibitors Cough  ? ? ?Patient Active Problem List  ? Diagnosis Date Noted  ? History of uterine fibroid 05/29/2017  ? Menopause 05/29/2017  ? Diverticulosis 02/18/2016  ? HLD (hyperlipidemia) 06/25/2015  ? Prediabetes 06/25/2015  ? Primary hypertension 04/17/2012  ? ? ?Past Medical History:  ?Diagnosis Date  ? Diverticulitis   ? Fibroids   ? GERD (gastroesophageal reflux disease)   ? High cholesterol   ? Hyperlipidemia   ? Hypertension   ? ? ?Past  Surgical History:  ?Procedure Laterality Date  ? HERNIA REPAIR    ? TUBAL LIGATION    ? ? ?Social History  ? ?Socioeconomic History  ? Marital status: Married  ?  Spouse name: Not on file  ? Number of children: Not on file  ? Years of education: Not on file  ? Highest education level: Not on file  ?Occupational History  ? Not on file  ?Tobacco Use  ? Smoking status: Never  ? Smokeless tobacco: Never  ?Vaping Use  ? Vaping Use: Never used  ?Substance and Sexual Activity  ? Alcohol use: No  ? Drug use: No  ? Sexual activity: Yes  ?  Partners: Male  ?  Birth control/protection: Surgical  ?  Comment: 1st intercourse- 17, partners- 76, married- 58 yrs   ?Other Topics Concern  ? Not on file  ?Social History Narrative  ? ** Merged History Encounter **  ?    ? ** Merged History Encounter **  ?    ? ?Social Determinants of Health  ? ?Financial Resource Strain: Not on file  ?Food Insecurity: Not on file  ?Transportation Needs: Not on file  ?Physical Activity: Not on file  ?Stress: Not on file  ?Social Connections: Not on file  ?Intimate Partner Violence: Not on file  ? ? ?Family History  ?Problem Relation Age of Onset  ? Stomach cancer Mother   ? Pneumonia Father   ?  Diabetes Sister   ? Diabetes Brother   ? Hypertension Sister   ? Hyperlipidemia Sister   ? Hypertension Sister   ? Liver disease Neg Hx   ? Pancreatic cancer Neg Hx   ? Esophageal cancer Neg Hx   ? Colon cancer Neg Hx   ? ? ? ?Review of Systems  ?Constitutional: Negative.  Negative for chills and fever.  ?HENT:  Positive for nosebleeds. Negative for congestion and sore throat.   ?Respiratory: Negative.  Negative for cough and shortness of breath.   ?Cardiovascular: Negative.  Negative for chest pain and palpitations.  ?Gastrointestinal:  Negative for abdominal pain, diarrhea, nausea and vomiting.  ?Genitourinary: Negative.   ?Skin: Negative.  Negative for rash.  ?Neurological:  Negative for dizziness and headaches.  ?All other systems reviewed and are  negative. ? ?Today's Vitals  ? 04/06/22 1050  ?BP: 116/74  ?Pulse: 93  ?Temp: 98.2 ?F (36.8 ?C)  ?TempSrc: Oral  ?SpO2: 96%  ?Weight: 181 lb 8 oz (82.3 kg)  ?Height: 5' (1.524 m)  ? ?Body mass index is 35.45 kg/m?. ? ?Physical Exam ?Vitals reviewed.  ?Constitutional:   ?   Appearance: Normal appearance.  ?HENT:  ?   Head: Normocephalic.  ?   Nose: Nose normal. No congestion or rhinorrhea.  ?   Mouth/Throat:  ?   Mouth: Mucous membranes are moist.  ?   Pharynx: Oropharynx is clear.  ?Eyes:  ?   Extraocular Movements: Extraocular movements intact.  ?   Pupils: Pupils are equal, round, and reactive to light.  ?Cardiovascular:  ?   Rate and Rhythm: Normal rate and regular rhythm.  ?   Pulses: Normal pulses.  ?   Heart sounds: Normal heart sounds.  ?Pulmonary:  ?   Effort: Pulmonary effort is normal.  ?   Breath sounds: Normal breath sounds.  ?Musculoskeletal:  ?   Cervical back: No tenderness.  ?Lymphadenopathy:  ?   Cervical: No cervical adenopathy.  ?Skin: ?   General: Skin is warm and dry.  ?   Capillary Refill: Capillary refill takes less than 2 seconds.  ?Neurological:  ?   General: No focal deficit present.  ?   Mental Status: She is alert and oriented to person, place, and time.  ?Psychiatric:     ?   Mood and Affect: Mood normal.     ?   Behavior: Behavior normal.  ? ? ? ?ASSESSMENT & PLAN: ?Problem List Items Addressed This Visit   ? ?  ? Cardiovascular and Mediastinum  ? Primary hypertension  ?  Well-controlled hypertension. ?BP Readings from Last 3 Encounters:  ?04/06/22 116/74  ?09/08/21 128/86  ?04/22/21 132/84  ?Continue amlodipine 5 mg daily. ?Dietary approaches to stop hypertension discussed. ? ?  ?  ? Relevant Orders  ? Comprehensive metabolic panel  ? CBC with Differential/Platelet  ?  ? Other  ? Dyslipidemia  ?  Stable.  Diet and nutrition discussed.  Lipid profile done today. ?Continue atorvastatin 20 mg daily. ?The 10-year ASCVD risk score (Arnett DK, et al., 2019) is: 8.7% ?  Values used to  calculate the score: ?    Age: 62 years ?    Sex: Female ?    Is Non-Hispanic African American: No ?    Diabetic: Yes ?    Tobacco smoker: No ?    Systolic Blood Pressure: 557 mmHg ?    Is BP treated: Yes ?    HDL Cholesterol: 57 mg/dL ?  Total Cholesterol: 247 mg/dL ? ? ?  ?  ? Relevant Orders  ? Lipid panel  ? Prediabetes  ?  Diet and nutrition discussed.  Hemoglobin A1c done today. ?Advised to decrease amount of daily carbohydrate intake and watch total daily caloric intake. ? ?  ?  ? Relevant Orders  ? Hemoglobin A1c  ? Epistaxis - Primary  ?  Resolved.  Epistaxis prophylaxis discussed. ?Advised to use saline nasal sprays several times a day. ? ?  ?  ? ?Patient Instructions  ?Hemorragia nasal en los adultos ?Nosebleed, Adult ?Cuando hay hemorragia nasal, sale sangre de la [place]. Las hemorragias nasales son Ardelia Mems afecci?n frecuente y pueden deberse a muchos factores. Por lo general, no indican un problema m?dico grave. ?Siga estas indicaciones en su casa: ?Si tiene una hemorragia nasal: ? ?Si?ntese. ?Incline la cabeza un poco hacia adelante. ?Siga estos pasos: ?Presione la nariz con una toalla o un pa?uelo de papel limpios. ?Contin?e presionando la nariz durante 5 minutos. No deje de hacerlo. ?Despu?s de 5 minutos, deje de presionar la nariz. ?Contin?e repitiendo [employer] que el sangrado se [place]. ?No coloque pa?uelos de papel ni otras cosas en la nariz para detener el sangrado. ?Evite acostarse o colocar la cabeza hacia atr?s. ?Use un aerosol nasal descongestivo seg?n lo indicado por su m?dico. ?Despu?s de una hemorragia nasal: ?Trate de no sonarse ni resoplarse la nariz durante varias horas. ?Trate de no hacer esfuerzos, levantar objetos ni doblar la cintura para agacharse durante varios d?as. ?La aspirina y los medicamentos que diluyen la sangre aumentan la probabilidad de sangrados. Si toma estos medicamentos: ?Pregunte a su m?dico si debe interrumpirlos o si debe cambiar la cantidad que toma. ?No  deje de tomar los medicamentos excepto que el m?dico se lo haya indicado. ?Si la causa de la hemorragia fue la sequedad, use gel o aerosol nasal de soluci?n salina de venta libre y un humidificador seg?n se lo haya indicado

## 2022-04-06 NOTE — Assessment & Plan Note (Signed)
Resolved.  Epistaxis prophylaxis discussed. ?Advised to use saline nasal sprays several times a day. ?

## 2022-04-06 NOTE — Assessment & Plan Note (Signed)
Stable.  Diet and nutrition discussed.  Lipid profile done today. ?Continue atorvastatin 20 mg daily. ?The 10-year ASCVD risk score (Arnett DK, et al., 2019) is: 8.7% ?  Values used to calculate the score: ?    Age: 61 years ?    Sex: Female ?    Is Non-Hispanic African American: No ?    Diabetic: Yes ?    Tobacco smoker: No ?    Systolic Blood Pressure: 094 mmHg ?    Is BP treated: Yes ?    HDL Cholesterol: 57 mg/dL ?    Total Cholesterol: 247 mg/dL ? ?

## 2022-04-06 NOTE — Assessment & Plan Note (Signed)
Well-controlled hypertension. ?BP Readings from Last 3 Encounters:  ?04/06/22 116/74  ?09/08/21 128/86  ?04/22/21 132/84  ?Continue amlodipine 5 mg daily. ?Dietary approaches to stop hypertension discussed. ? ?

## 2022-04-26 ENCOUNTER — Ambulatory Visit: Payer: Self-pay | Admitting: Obstetrics & Gynecology

## 2022-06-28 ENCOUNTER — Other Ambulatory Visit: Payer: Self-pay | Admitting: Emergency Medicine

## 2022-06-28 DIAGNOSIS — I10 Essential (primary) hypertension: Secondary | ICD-10-CM

## 2022-09-14 ENCOUNTER — Encounter: Payer: Self-pay | Admitting: Obstetrics & Gynecology

## 2022-09-14 ENCOUNTER — Ambulatory Visit (INDEPENDENT_AMBULATORY_CARE_PROVIDER_SITE_OTHER): Payer: Self-pay | Admitting: Obstetrics & Gynecology

## 2022-09-14 VITALS — BP 118/68 | HR 81 | Ht 59.75 in | Wt 179.0 lb

## 2022-09-14 DIAGNOSIS — Z01419 Encounter for gynecological examination (general) (routine) without abnormal findings: Secondary | ICD-10-CM

## 2022-09-14 DIAGNOSIS — N950 Postmenopausal bleeding: Secondary | ICD-10-CM

## 2022-09-14 DIAGNOSIS — Z780 Asymptomatic menopausal state: Secondary | ICD-10-CM

## 2022-09-14 NOTE — Progress Notes (Addendum)
Sheila [name] 11-06-1961 [id number]   History:    61 y.o. G5P3A2L3 Married.  S/P TL.   RP:  Established patient presenting for annual gyn exam    HPI: Postmenopause, well on no HRT.  1 episode of mild spotting PMB 2 weeks ago.  No pelvic cramps or pain associated.  Occasionally sexually active, recommend coconut oil.  Pap Neg 04/2021.  No h/o abnormal Pap. Will repeat Pap at 3 years. No fever.  H/O Diverticulosis.  Urine/BMs normal. Breasts normal.  Last Mammo Neg 10/2016, recommend scheduling screening mammo right now.  BMI 35.25.  Needs to increase walking. Health Labs with Fam MD. [name] 10/2020, Polyps removed.  Past medical history,surgical history, family history and social history were all reviewed and documented in the EPIC chart.  Gynecologic History Patient's last menstrual period was 05/08/2017 (approximate).  Obstetric History OB History  Gravida Para Term Preterm AB Living  '5 3 3 '$ 0 2 3  SAB IAB Ectopic Multiple Live Births  2 0 0        # Outcome Date GA Lbr Len/2nd Weight Sex Delivery Anes PTL Lv  5 SAB           4 SAB           3 Term           2 Term           1 Term              ROS: A ROS was performed and pertinent positives and negatives are included in the history. GENERAL: No fevers or chills. HEENT: No change in vision, no earache, sore throat or sinus congestion. NECK: No pain or stiffness. CARDIOVASCULAR: No chest pain or pressure. No palpitations. PULMONARY: No shortness of breath, cough or wheeze. GASTROINTESTINAL: No abdominal pain, nausea, vomiting or diarrhea, melena or bright red blood per rectum. GENITOURINARY: No urinary frequency, urgency, hesitancy or dysuria. MUSCULOSKELETAL: No joint or muscle pain, no back pain, no recent trauma. DERMATOLOGIC: No rash, no itching, no lesions. ENDOCRINE: No polyuria, polydipsia, no heat or cold intolerance. No recent change in weight. HEMATOLOGICAL: No anemia or easy bruising or bleeding. NEUROLOGIC:  No headache, seizures, numbness, tingling or weakness. PSYCHIATRIC: No depression, no loss of interest in normal activity or change in sleep pattern.     Exam:   BP 118/68   Pulse 81   Ht 4' 11.75" (1.518 m)   Wt 179 lb (81.2 kg)   LMP 05/08/2017 (Approximate)   SpO2 99%   BMI 35.25 kg/m   Body mass index is 35.25 kg/m.  General appearance : Well developed well nourished female. No acute distress HEENT: Eyes: no retinal hemorrhage or exudates,  Neck supple, trachea midline, no carotid bruits, no thyroidmegaly Lungs: Clear to auscultation, no rhonchi or wheezes, or rib retractions  Heart: Regular rate and rhythm, no murmurs or gallops Breast:Examined in sitting and supine position were symmetrical in appearance, no palpable masses or tenderness,  no skin retraction, no nipple inversion, no nipple discharge, no skin discoloration, no axillary or supraclavicular lymphadenopathy Abdomen: no palpable masses or tenderness, no rebound or guarding Extremities: no edema or skin discoloration or tenderness  Pelvic: Vulva: Normal             Vagina: No gross lesions or discharge  Cervix: No gross lesions or discharge  Uterus  AV, normal size, shape and consistency, non-tender and mobile  Adnexa  Without masses or tenderness  Anus: Normal  U/A Slightly cloudy, Pro Neg, Nit Neg, WBC 10-20, RBC 0-2, Few Bacteria.  Pending U. Culture.   Assessment/Plan:  61 y.o. female for annual exam   1. Well female exam with routine gynecological exam Postmenopause, well on no HRT.  1 episode of mild spotting PMB 2 weeks ago.  No pelvic cramps or pain associated.  Occasionally sexually active, recommend coconut oil.  Pap Neg 04/2021.  No h/o abnormal Pap. Will repeat Pap at 3 years. No fever.  H/O Diverticulosis. Urine/BMs normal. Breasts normal.  Last Mammo Neg 10/2016, recommend scheduling screening mammo right now.  BMI 35.25.  Needs to increase walking. Health Labs with Fam MD. [name] 10/2020, Polyps  removed.  2. Postmenopause Postmenopause, well on no HRT.  1 episode of mild spotting PMB 2 weeks ago.  No pelvic cramps or pain associated.  Will f/u with a Pelvic US to investigate.  Occasionally sexually active, recommend coconut oil.  3. Postmenopausal bleeding Postmenopause, well on no HRT.  1 episode of mild spotting PMB 2 weeks ago.  No pelvic cramps or pain associated.  U/A only mildly perturbed, RBC 0-2.  Will wait on U. Culture to decide on treatment. Will f/u with a Pelvic US to investigate.  - Urinalysis,Complete w/RFL Culture - US Transvaginal Non-OB; Future  Other orders - Cyanocobalamin (B-12 PO); Take by mouth.   Princess Bruins MD, 8:53 AM 09/14/2022

## 2022-09-22 ENCOUNTER — Other Ambulatory Visit: Payer: Self-pay | Admitting: *Deleted

## 2022-09-22 DIAGNOSIS — Z1231 Encounter for screening mammogram for malignant neoplasm of breast: Secondary | ICD-10-CM

## 2022-09-28 LAB — URINE CULTURE
MICRO NUMBER:: 14042489
SPECIMEN QUALITY:: ADEQUATE

## 2022-09-28 LAB — URINALYSIS, COMPLETE W/RFL CULTURE
Bilirubin Urine: NEGATIVE
Glucose, UA: NEGATIVE
Hgb urine dipstick: NEGATIVE
Hyaline Cast: NONE SEEN /LPF
Ketones, ur: NEGATIVE
Nitrites, Initial: NEGATIVE
Protein, ur: NEGATIVE
Specific Gravity, Urine: 1.015 (ref 1.001–1.035)
pH: 6.5 (ref 5.0–8.0)

## 2022-09-28 LAB — CULTURE INDICATED

## 2022-10-17 ENCOUNTER — Ambulatory Visit (INDEPENDENT_AMBULATORY_CARE_PROVIDER_SITE_OTHER): Payer: Self-pay

## 2022-10-17 ENCOUNTER — Ambulatory Visit: Payer: Self-pay | Admitting: Obstetrics & Gynecology

## 2022-10-17 ENCOUNTER — Encounter: Payer: Self-pay | Admitting: Obstetrics & Gynecology

## 2022-10-17 VITALS — BP 130/84 | HR 84

## 2022-10-17 DIAGNOSIS — N950 Postmenopausal bleeding: Secondary | ICD-10-CM

## 2022-10-17 NOTE — Progress Notes (Signed)
    Sheila [name] 12-23-1960 [id number]        61 y.o.  [id number]   RP: PMB for Pelvic US  HPI: No recurrence of light PMB x mid 08/2022.  Postmenopause, well on no HRT.  No pelvic pain.  Occasionally sexually active, recommend coconut oil.  Pap Neg 04/2021.    OB History  Gravida Para Term Preterm AB Living  '5 3 3 '$ 0 2 3  SAB IAB Ectopic Multiple Live Births  2 0 0        # Outcome Date GA Lbr Len/2nd Weight Sex Delivery Anes PTL Lv  5 SAB           4 SAB           3 Term           2 Term           1 Term             Past medical history,surgical history, problem list, medications, allergies, family history and social history were all reviewed and documented in the EPIC chart.   Directed ROS with pertinent positives and negatives documented in the history of present illness/assessment and plan.  Exam:  Vitals:   10/17/22 1606  BP: 130/84  Pulse: 84  SpO2: 97%   General appearance:  Normal  Pelvic US today: Comparison is made with previous scan December 2020.  T/V images.  Anteverted uterus with several fibroids with no significant change in uterine or fibroid size.  The uterus is measured at 8.01 x 7.33 x 5.12 cm.  3 fibroids are measured the largest at 4.48 cm, 3.09 cm and 0.88 cm.  The endometrial lining is thin and symmetrical measured at 2.89 mm with no change since the previous scan.  No mass or thickening or abnormal blood flow are seen at the endometrial lining.  Both ovaries are small with atrophic appearance.  No adnexal mass.  No free fluid in the pelvis.   Assessment/Plan:  61 y.o. [id number]   1. Postmenopausal bleeding  No recurrence of light PMB x mid 08/2022.  Postmenopause, well on no HRT.  No pelvic pain.  Occasionally sexually active, recommend coconut oil.  Pap Neg 04/2021. Pelvic US findings thoroughly reviewed with patient.  The endometrial line is thin and normal at 2.89 mm.  Patient reassured.  Will f/u when due for an Annual gyn exam in  09/2023.  Princess Bruins MD, 4:20 PM 10/17/2022

## 2022-10-18 ENCOUNTER — Encounter: Payer: Self-pay | Admitting: Obstetrics & Gynecology

## 2022-11-07 ENCOUNTER — Ambulatory Visit: Payer: Self-pay | Admitting: *Deleted

## 2022-11-07 ENCOUNTER — Other Ambulatory Visit: Payer: Self-pay | Admitting: *Deleted

## 2022-11-07 ENCOUNTER — Ambulatory Visit: Payer: No Typology Code available for payment source

## 2022-11-07 VITALS — BP 110/82 | Wt 181.8 lb

## 2022-11-07 DIAGNOSIS — N6311 Unspecified lump in the right breast, upper outer quadrant: Secondary | ICD-10-CM

## 2022-11-07 DIAGNOSIS — N644 Mastodynia: Secondary | ICD-10-CM

## 2022-11-07 DIAGNOSIS — Z1211 Encounter for screening for malignant neoplasm of colon: Secondary | ICD-10-CM

## 2022-11-07 DIAGNOSIS — Z1239 Encounter for other screening for malignant neoplasm of breast: Secondary | ICD-10-CM

## 2022-11-07 NOTE — Patient Instructions (Signed)
Explained breast self awareness with Merrilee Jansky. Patient did not need a Pap smear today due to last Pap smear and HPV typing was 04/22/2021. Let her know [facility]CP will cover Pap smears and HPV Typing every 5 years unless has a history of abnormal Pap smears. Referred patient to the [facility] for a diagnostic mammogram. Appointment scheduled Tuesday, November 07, 2022 at 1130. Patient aware of appointment and will be there. Therma Enriquez-Barrientos verbalized understanding.  Saray Capasso, Arvil Chaco, RN 11:27 AM

## 2022-11-07 NOTE — Progress Notes (Signed)
[name] is a 61 y.o. female who presents to [facility] clinic today with complaint of right upper outer breast pain x 10 years and right breast lump near previous biopsy site x 3-4 weeks. Patient states the pain comes and goes. Patient rates the pain at a 8 out of 10.    Pap Smear: Pap smear not completed today. Last Pap smear was 04/22/2021 at Dr. Assunta Curtis clinic and was normal. Per patient has history of an abnormal Pap smear in 2003 that a colposcopy was completed for follow up 11/13/2002 that was benign. Last Pap smear result is available in Epic.   Physical exam: Breasts Breasts symmetrical. No skin abnormalities bilateral breasts. No nipple retraction bilateral breasts. No nipple discharge bilateral breasts. No lymphadenopathy. No lumps palpated left breast. Palpated a lump within the right breast at 11 o'clock 1 cm from the nipple. Complaints of right upper outer breast pain on exam.  Pelvic/Bimanual Pap is not indicated today per BCCCP guidelines.   Smoking History: Patient has never smoked.   Patient Navigation: Patient education provided. Access to services provided for patient through [place] program. Spanish interpreter Rudene Anda from [facility] provided.    Colorectal Cancer Screening: Per patient has had colonoscopy completed on 10/04/2020. FIT Test given to patient to complete.  No complaints today.    Breast and Cervical Cancer Risk Assessment: Patient does not have family history of breast cancer, known genetic mutations, or radiation treatment to the chest before age 42. Per patient has history of cervical dysplasia. Patient has no history of being immunocompromised or DES exposure in-utero.  Risk Scores as of 11/07/2022     [name]           5-year 1.3 %   Lifetime 5.92 %   This patient is His[place]/Lat[place] but has no documented birth country, so the [place] used data from [place] patients to calculate their risk score. Document a birth country in the  Demographics activity for a more accurate score.         Last calculated by Royston Bake, CMA on 11/07/2022 at 10:47 AM        A: B[facility]CP exam without pap smear Complaint of right breast lump and pain.  P: Referred patient to the [facility] for a diagnostic mammogram. Appointment scheduled Tuesday, November 07, 2022 at 1130.  Loletta Parish, RN 11/07/2022 11:27 AM

## 2022-11-16 ENCOUNTER — Ambulatory Visit: Payer: No Typology Code available for payment source

## 2022-11-16 ENCOUNTER — Ambulatory Visit
Admission: RE | Admit: 2022-11-16 | Discharge: 2022-11-16 | Disposition: A | Discharge status: Home / Self Care | Payer: No Typology Code available for payment source | Source: Ambulatory Visit | Attending: Obstetrics and Gynecology | Admitting: Obstetrics and Gynecology

## 2022-11-16 DIAGNOSIS — N644 Mastodynia: Secondary | ICD-10-CM

## 2022-11-22 ENCOUNTER — Ambulatory Visit (INDEPENDENT_AMBULATORY_CARE_PROVIDER_SITE_OTHER): Payer: Self-pay | Admitting: Emergency Medicine

## 2022-11-22 ENCOUNTER — Encounter: Payer: Self-pay | Admitting: Emergency Medicine

## 2022-11-22 VITALS — BP 136/84 | HR 89 | Temp 99.0°F | Ht 59.5 in | Wt 184.1 lb

## 2022-11-22 DIAGNOSIS — K5792 Diverticulitis of intestine, part unspecified, without perforation or abscess without bleeding: Secondary | ICD-10-CM | POA: Insufficient documentation

## 2022-11-22 DIAGNOSIS — I10 Essential (primary) hypertension: Secondary | ICD-10-CM

## 2022-11-22 DIAGNOSIS — Z8719 Personal history of other diseases of the digestive system: Secondary | ICD-10-CM | POA: Insufficient documentation

## 2022-11-22 LAB — URINALYSIS, ROUTINE W REFLEX MICROSCOPIC
Bilirubin Urine: NEGATIVE
Hgb urine dipstick: NEGATIVE
Ketones, ur: NEGATIVE
Nitrite: NEGATIVE
Specific Gravity, Urine: 1.01 (ref 1.000–1.030)
Total Protein, Urine: NEGATIVE
Urine Glucose: NEGATIVE
Urobilinogen, UA: 0.2 (ref 0.0–1.0)
pH: 6.5 (ref 5.0–8.0)

## 2022-11-22 LAB — COMPREHENSIVE METABOLIC PANEL
ALT: 17 U/L (ref 0–35)
AST: 14 U/L (ref 0–37)
Albumin: 4.4 g/dL (ref 3.5–5.2)
Alkaline Phosphatase: 100 U/L (ref 39–117)
BUN: 10 mg/dL (ref 6–23)
CO2: 28 mEq/L (ref 19–32)
Calcium: 9.4 mg/dL (ref 8.4–10.5)
Chloride: 104 mEq/L (ref 96–112)
Creatinine, Ser: 0.66 mg/dL (ref 0.40–1.20)
GFR: 94.15 mL/min (ref >60.00)
Glucose, Bld: 98 mg/dL (ref 70–99)
Potassium: 4.2 mEq/L (ref 3.5–5.1)
Sodium: 142 mEq/L (ref 135–145)
Total Bilirubin: 0.4 mg/dL (ref 0.2–1.2)
Total Protein: 7.9 g/dL (ref 6.0–8.3)

## 2022-11-22 LAB — CBC WITH DIFFERENTIAL/PLATELET
Basophils Absolute: 0.1 10*3/uL (ref 0.0–0.1)
Basophils Relative: 0.7 % (ref 0.0–3.0)
Eosinophils Absolute: 0.4 10*3/uL (ref 0.0–0.7)
Eosinophils Relative: 4.6 % (ref 0.0–5.0)
HCT: 38.5 % (ref 36.0–46.0)
Hemoglobin: 13 g/dL (ref 12.0–15.0)
Lymphocytes Relative: 33.2 % (ref 12.0–46.0)
Lymphs Abs: 2.5 10*3/uL (ref 0.7–4.0)
MCHC: 33.8 g/dL (ref 30.0–36.0)
MCV: 86.3 fl (ref 78.0–100.0)
Monocytes Absolute: 0.5 10*3/uL (ref 0.1–1.0)
Monocytes Relative: 6.4 % (ref 3.0–12.0)
Neutro Abs: 4.2 10*3/uL (ref 1.4–7.7)
Neutrophils Relative %: 55.1 % (ref 43.0–77.0)
Platelets: 302 10*3/uL (ref 150.0–400.0)
RBC: 4.47 Mil/uL (ref 3.87–5.11)
RDW: 13.6 % (ref 11.5–15.5)
WBC: 7.6 10*3/uL (ref 4.0–10.5)

## 2022-11-22 MED ORDER — AMOXICILLIN-POT CLAVULANATE 875-125 MG PO TABS
1.0000 | ORAL_TABLET | Freq: Two times a day (BID) | ORAL | 0 refills | Status: AC
Start: 2022-11-22 — End: 2022-11-29

## 2022-11-22 NOTE — Patient Instructions (Signed)
Diverticulitis  Diverticulitis is when small pouches in your colon (large intestine) get infected or swollen. This causes pain in the belly (abdomen) and watery poop (diarrhea). These pouches are called diverticula. The pouches form in people who have a condition called diverticulosis. What are the causes? This condition may be caused by poop (stool) that gets trapped in the pouches in your colon. The poop lets germs (bacteria) grow in the pouches. This causes the infection. What increases the risk? You are more likely to get this condition if you have small pouches in your colon. The risk is higher if: You are overweight or very overweight (obese). You do not exercise enough. You drink alcohol. You smoke or use products with tobacco in them. You eat a diet that has a lot of red meat such as beef, pork, or lamb. You eat a diet that does not have enough fiber in it. You are older than 62 years of age. What are the signs or symptoms? Pain in the belly. Pain is often on the left side, but it may be in other areas. Fever and feeling cold. Feeling like you may vomit. Vomiting. Having cramps. Feeling full. Changes to how often you poop. Blood in your poop. How is this treated? Most cases are treated at home by: Taking over-the-counter pain medicines. Following a clear liquid diet. Taking antibiotic medicines. Resting. Very bad cases may need to be treated at a hospital. This may include: Not eating or drinking. Taking prescription pain medicine. Getting antibiotic medicines through an IV tube. Getting fluid and food through an IV tube. Having surgery. When you are feeling better, your doctor may tell you to have a test to check your colon (colonoscopy). Follow these instructions at home: Medicines Take over-the-counter and prescription medicines only as told by your doctor. These include: Antibiotics. Pain medicines. Fiber pills. Probiotics. Stool softeners. If you were  prescribed an antibiotic medicine, take it as told by your doctor. Do not stop taking the antibiotic even if you start to feel better. Ask your doctor if the medicine prescribed to you requires you to avoid driving or using machinery. Eating and drinking  Follow a diet as told by your doctor. When you feel better, your doctor may tell you to change your diet. You may need to eat a lot of fiber. Fiber makes it easier to poop (have a bowel movement). Foods with fiber include: Berries. Beans. Lentils. Green vegetables. Avoid eating red meat. General instructions Do not use any products that contain nicotine or tobacco, such as cigarettes, e-cigarettes, and chewing tobacco. If you need help quitting, ask your doctor. Exercise 3 or more times a week. Try to get 30 minutes each time. Exercise enough to sweat and make your heart beat faster. Keep all follow-up visits as told by your doctor. This is important. Contact a doctor if: Your pain does not get better. You are not pooping like normal. Get help right away if: Your pain gets worse. Your symptoms do not get better. Your symptoms get worse very fast. You have a fever. You vomit more than one time. You have poop that is: Bloody. Black. Tarry. Summary This condition happens when small pouches in your colon get infected or swollen. Take medicines only as told by your doctor. Follow a diet as told by your doctor. Keep all follow-up visits as told by your doctor. This is important. This information is not intended to replace advice given to you by your health care provider. Make sure   you discuss any questions you have with your health care provider. Document Revised: 09/01/2019 Document Reviewed: 09/01/2019 Elsevier Patient Education  2023 Elsevier Inc.  

## 2022-11-22 NOTE — Progress Notes (Signed)
Sheila [name] 61 y.o.   Chief Complaint  Patient presents with   Follow-up    F/u abd pain, flare up diverticulitis     HISTORY OF PRESENT ILLNESS: This is a 61 y.o. female complaining of possible diverticulitis flareup. Has history of diverticulosis. Low-grade fever.  Sore throat this morning Occasional nausea but no vomiting Change in bowel habit pattern. No other complaints or medical concerns today.  HPI   Prior to Admission medications   Medication Sig Start Date End Date Taking? Authorizing Provider  amLODipine (NORVASC) 5 MG tablet Take 1 tablet by mouth once daily 06/29/22  Yes Adrienna Karis, [place], MD  Cyanocobalamin (B-12 PO) Take by mouth.   Yes [provider]    Allergies  Allergen Reactions   Ace Inhibitors Cough    Patient Active Problem List   Diagnosis Date Noted   Epistaxis 04/06/2022   History of uterine fibroid 05/29/2017   Menopause 05/29/2017   Diverticulosis 02/18/2016   Dyslipidemia 06/25/2015   Prediabetes 06/25/2015   Primary hypertension 04/17/2012    Past Medical History:  Diagnosis Date   Diverticulitis    Fibroids    GERD (gastroesophageal reflux disease)    High cholesterol    Hyperlipidemia    Hypertension     Past Surgical History:  Procedure Laterality Date   BREAST BIOPSY Right 01/06/2004   HERNIA REPAIR     TUBAL LIGATION      Social History   Socioeconomic History   Marital status: Married    Spouse name: Not on file   Number of children: Not on file   Years of education: Not on file   Highest education level: Not on file  Occupational History   Not on file  Tobacco Use   Smoking status: Never   Smokeless tobacco: Never  Vaping Use   Vaping Use: Never used  Substance and Sexual Activity   Alcohol use: No   Drug use: No   Sexual activity: Yes    Partners: Male    Birth control/protection: Surgical, Post-menopausal    Comment: 1st intercourse- 17, partners- 2, btl  Other Topics  Concern   Not on file  Social History Narrative   ** Merged History Encounter **       ** Merged History Encounter **       Social Determinants of Health   Financial Resource Strain: Not on file  Food Insecurity: No Food Insecurity (11/07/2022)   Hunger Vital Sign    Worried About Running Out of Food in the Last Year: Never true    Ran Out of Food in the Last Year: Never true  Transportation Needs: No Transportation Needs (11/07/2022)   PRAPARE - Hydrologist (Medical): No    Lack of Transportation (Non-Medical): No  Physical Activity: Not on file  Stress: Not on file  Social Connections: Not on file  Intimate Partner Violence: Not on file    Family History  Problem Relation Age of Onset   Stomach cancer Mother    Pneumonia Father    Diabetes Sister    Hypertension Sister    Hyperlipidemia Sister    Hypertension Sister    Hyperlipidemia Brother    Diabetes Brother      Review of Systems  Constitutional:  Positive for fever.  HENT:  Positive for sore throat.   Respiratory: Negative.  Negative for cough and shortness of breath.   Cardiovascular: Negative.  Negative for chest pain and palpitations.  Gastrointestinal:  Positive for abdominal pain, constipation and nausea. Negative for blood in stool, diarrhea, melena and vomiting.  Genitourinary: Negative.  Negative for dysuria and hematuria.  Neurological: Negative.  Negative for dizziness and headaches.   Today's Vitals   11/22/22 0903  BP: 136/84  Pulse: 89  Temp: 99 F (37.2 C)  TempSrc: Oral  SpO2: 97%  Weight: 184 lb 2 oz (83.5 kg)  Height: 4' 11.5" (1.511 m)   Body mass index is 36.57 kg/m.   Physical Exam Vitals reviewed.  Constitutional:      Appearance: Normal appearance.  HENT:     Head: Normocephalic.  Eyes:     Extraocular Movements: Extraocular movements intact.     Pupils: Pupils are equal, round, and reactive to light.  Cardiovascular:     Rate and Rhythm:  Normal rate and regular rhythm.     Pulses: Normal pulses.     Heart sounds: Normal heart sounds.  Pulmonary:     Effort: Pulmonary effort is normal.     Breath sounds: Normal breath sounds.  Abdominal:     General: There is no distension.     Tenderness: There is abdominal tenderness (Mild left lower abdominal tenderness). There is no right CVA tenderness, left CVA tenderness or guarding.  Musculoskeletal:     Cervical back: No tenderness.  Lymphadenopathy:     Cervical: No cervical adenopathy.  Skin:    General: Skin is warm and dry.  Neurological:     General: No focal deficit present.     Mental Status: She is alert and oriented to person, place, and time.  Psychiatric:        Mood and Affect: Mood normal.        Behavior: Behavior normal.      ASSESSMENT & PLAN: A total of 47 minutes was spent with the patient and counseling/coordination of care regarding preparing for this visit, review of most recent office visit notes, review of chronic medical conditions and their management, review of all medications, diagnosis of diverticulitis and need to start antibiotics, education on nutrition, importance of staying well-hydrated, pain management, ED precautions, prognosis, documentation, and need for follow-up.  Problem List Items Addressed This Visit       Cardiovascular and Mediastinum   Primary hypertension    Well-controlled hypertension. Continue amlodipine 5 mg daily. Cardiovascular risks associated with hypertension discussed. BP Readings from Last 3 Encounters:  11/22/22 136/84  11/07/22 110/82  10/17/22 130/84          Digestive   Acute diverticulitis - Primary    Diet and nutrition discussed. Continue bland diet. Tylenol as needed for pain.  Avoid NSAIDs. May benefit from starting Augmentin 875 mg twice a day for 7 days Blood work done today. ED precautions given      Relevant Medications   amoxicillin-clavulanate (AUGMENTIN) 875-125 MG tablet   Other  Relevant Orders   CBC with Differential/Platelet   Comprehensive metabolic panel   Urinalysis     Other   History of diverticulosis   Patient Instructions  Diverticulitis  Diverticulitis is when small pouches in your colon (large intestine) get infected or swollen. This causes pain in the belly (abdomen) and watery poop (diarrhea). These pouches are called diverticula. The pouches form in people who have a condition called diverticulosis. What are the causes? This condition may be caused by poop (stool) that gets trapped in the pouches in your colon. The poop lets germs (bacteria) grow in the pouches. This causes  the infection. What increases the risk? You are more likely to get this condition if you have small pouches in your colon. The risk is higher if: You are overweight or very overweight (obese). You do not exercise enough. You drink alcohol. You smoke or use products with tobacco in them. You eat a diet that has a lot of red meat such as beef, pork, or lamb. You eat a diet that does not have enough fiber in it. You are older than 62 years of age. What are the signs or symptoms? Pain in the belly. Pain is often on the left side, but it may be in other areas. Fever and feeling cold. Feeling like you may vomit. Vomiting. Having cramps. Feeling full. Changes to how often you poop. Blood in your poop. How is this treated? Most cases are treated at home by: Taking over-the-counter pain medicines. Following a clear liquid diet. Taking antibiotic medicines. Resting. Very bad cases may need to be treated at a hospital. This may include: Not eating or drinking. Taking prescription pain medicine. Getting antibiotic medicines through an IV tube. Getting fluid and food through an IV tube. Having surgery. When you are feeling better, your doctor may tell you to have a test to check your colon (colonoscopy). Follow these instructions at home: Medicines Take over-the-counter  and prescription medicines only as told by your doctor. These include: Antibiotics. Pain medicines. Fiber pills. Probiotics. Stool softeners. If you were prescribed an antibiotic medicine, take it as told by your doctor. Do not stop taking the antibiotic even if you start to feel better. Ask your doctor if the medicine prescribed to you requires you to avoid driving or using machinery. Eating and drinking  Follow a diet as told by your doctor. When you feel better, your doctor may tell you to change your diet. You may need to eat a lot of fiber. Fiber makes it easier to poop (have a bowel movement). Foods with fiber include: Berries. Beans. Lentils. Green vegetables. Avoid eating red meat. General instructions Do not use any products that contain nicotine or tobacco, such as cigarettes, e-cigarettes, and chewing tobacco. If you need help quitting, ask your doctor. Exercise 3 or more times a week. Try to get 30 minutes each time. Exercise enough to sweat and make your heart beat faster. Keep all follow-up visits as told by your doctor. This is important. Contact a doctor if: Your pain does not get better. You are not pooping like normal. Get help right away if: Your pain gets worse. Your symptoms do not get better. Your symptoms get worse very fast. You have a fever. You vomit more than one time. You have poop that is: Bloody. Black. Tarry. Summary This condition happens when small pouches in your colon get infected or swollen. Take medicines only as told by your doctor. Follow a diet as told by your doctor. Keep all follow-up visits as told by your doctor. This is important. This information is not intended to replace advice given to you by your health care provider. Make sure you discuss any questions you have with your health care provider. Document Revised: 09/01/2019 Document Reviewed: 09/01/2019 Elsevier Patient Education  [place],  MD [facility]

## 2022-11-22 NOTE — Assessment & Plan Note (Signed)
Diet and nutrition discussed. Continue bland diet. Tylenol as needed for pain.  Avoid NSAIDs. May benefit from starting Augmentin 875 mg twice a day for 7 days Blood work done today. ED precautions given

## 2022-11-22 NOTE — Assessment & Plan Note (Signed)
Well-controlled hypertension. Continue amlodipine 5 mg daily. Cardiovascular risks associated with hypertension discussed. BP Readings from Last 3 Encounters:  11/22/22 136/84  11/07/22 110/82  10/17/22 130/84

## 2023-01-22 ENCOUNTER — Other Ambulatory Visit (HOSPITAL_COMMUNITY)
Admission: RE | Admit: 2023-01-22 | Discharge: 2023-01-22 | Disposition: A | Discharge status: Home / Self Care | Payer: Self-pay | Source: Ambulatory Visit | Attending: Obstetrics & Gynecology | Admitting: Obstetrics & Gynecology

## 2023-01-22 ENCOUNTER — Ambulatory Visit (INDEPENDENT_AMBULATORY_CARE_PROVIDER_SITE_OTHER): Payer: Self-pay | Admitting: Obstetrics & Gynecology

## 2023-01-22 ENCOUNTER — Encounter: Payer: Self-pay | Admitting: Obstetrics & Gynecology

## 2023-01-22 VITALS — BP 138/80 | HR 82

## 2023-01-22 DIAGNOSIS — N950 Postmenopausal bleeding: Secondary | ICD-10-CM

## 2023-01-22 NOTE — Progress Notes (Signed)
    Sheila [name] 1961-11-02 [id number]        62 y.o.  [id number]   RP: PMB x 1 week  HPI: Light PMB x 1 week. Not on HRT.  No pelvic pain.  No vaginal discharge.  No UTI Sx.  Occasionally sexually active, no post coital bleeding.  Had similar light PMB in Sept-Oct 2023 and a Pelvic US on 10/17/22 showed a thin normal endometrial line at 2.89 mm.     OB History  Gravida Para Term Preterm AB Living  5 3 3 $ 0 2 3  SAB IAB Ectopic Multiple Live Births  2 0 0        # Outcome Date GA Lbr Len/2nd Weight Sex Delivery Anes PTL Lv  5 SAB           4 SAB           3 Term           2 Term           1 Term             Past medical history,surgical history, problem list, medications, allergies, family history and social history were all reviewed and documented in the EPIC chart.   Directed ROS with pertinent positives and negatives documented in the history of present illness/assessment and plan.  Exam:  Vitals:   01/22/23 1344  BP: 138/80  Pulse: 82   General appearance:  Normal  Written informed consent signed.  Endometrial Bx: Speculum.  Cervix normal.  Betadine prep.  Hurricane spray.  Tenaculum on anterior lip of cervix.  Os finder.  Endometrial pipette easily inserted in the IU cavity.  EBx by negative pressure on all IU surfaces.  Good specimen obtained.  All instruments removed.  Good hemostasis.  Well tolerated with a uterine cramp.  No immediate Cx.    U/A: Dark yellow, clear, Pro 1+, Nit Neg, WBC 0-5, RBC 0-2, Bacteria Few.   Assessment/Plan:  62 y.o. [id number]   1. Postmenopausal bleeding Light PMB x 1 week. Not on HRT.  No pelvic pain.  No vaginal discharge.  No UTI Sx.  Occasionally sexually active, no post coital bleeding.  Had similar light PMB in Sept-Oct 2023 and a Pelvic US on 10/17/22 showed a thin normal endometrial line at 2.89 mm.  EBx done today. No immediate Cx. Well tolerated.  Post procedure precautions.  Manage per results.   U/A no significant  abnormality. - Urinalysis,Complete w/RFL Culture - Surgical pathology( [facility]/ [facility])   Princess Bruins MD, 1:47 PM 01/22/2023

## 2023-01-24 LAB — URINALYSIS, COMPLETE W/RFL CULTURE
Bilirubin Urine: NEGATIVE
Glucose, UA: NEGATIVE
Hgb urine dipstick: NEGATIVE
Hyaline Cast: NONE SEEN /LPF
Nitrites, Initial: NEGATIVE
Specific Gravity, Urine: 1.02 (ref 1.001–1.035)
pH: 7 (ref 5.0–8.0)

## 2023-01-24 LAB — SURGICAL PATHOLOGY

## 2023-01-24 LAB — CULTURE INDICATED

## 2023-01-24 LAB — URINE CULTURE
MICRO NUMBER:: 14582983
Result:: NO GROWTH
SPECIMEN QUALITY:: ADEQUATE

## 2023-01-27 ENCOUNTER — Other Ambulatory Visit: Payer: Self-pay | Admitting: Emergency Medicine

## 2023-01-27 DIAGNOSIS — K5792 Diverticulitis of intestine, part unspecified, without perforation or abscess without bleeding: Secondary | ICD-10-CM

## 2023-01-30 ENCOUNTER — Telehealth: Payer: Self-pay

## 2023-01-30 NOTE — Telephone Encounter (Signed)
I received message from Wyatt Portela that patient called for results from her 01/22/23 visit.  I reviewed her results and told Wyatt Portela she could let patient know Dr. Marguerita Merles has not yet reviewed and commented but that I looked at the result and her endometrial biopsy is benign and urine culture negative for infection.  She will tell patient that once Dr. Marguerita Merles reviews results we will call her.

## 2023-01-31 ENCOUNTER — Telehealth: Payer: Self-pay

## 2023-01-31 NOTE — Telephone Encounter (Signed)
Sheila [name] spoke with this Spanish speaking patient and informed her of result note info.  Patient is asking what the next step is if she is still having discomfort. Does she need another ultrasound?

## 2023-01-31 NOTE — Telephone Encounter (Signed)
-----   Message from Sinclair Grooms sent at 01/31/2023 12:35 PM EST ----- Done/ see note for patient question ----- Message ----- From: Ramond Craver, RMA Sent: 01/31/2023  11:06 AM EST To: [facility] Spanish  Endometrial biopsy benign. Urine Culture No growth/negative. Reassure patient.  Routed to [place] pool.

## 2023-02-01 NOTE — Telephone Encounter (Signed)
Sheila [name] spoke with patient 01/31/23 and informed her.

## 2023-02-06 ENCOUNTER — Ambulatory Visit (INDEPENDENT_AMBULATORY_CARE_PROVIDER_SITE_OTHER): Payer: Self-pay | Admitting: Emergency Medicine

## 2023-02-06 ENCOUNTER — Encounter: Payer: Self-pay | Admitting: Emergency Medicine

## 2023-02-06 VITALS — BP 136/86 | HR 75 | Temp 98.5°F | Ht 59.5 in | Wt 175.5 lb

## 2023-02-06 DIAGNOSIS — J039 Acute tonsillitis, unspecified: Secondary | ICD-10-CM | POA: Insufficient documentation

## 2023-02-06 DIAGNOSIS — I10 Essential (primary) hypertension: Secondary | ICD-10-CM

## 2023-02-06 DIAGNOSIS — Z8719 Personal history of other diseases of the digestive system: Secondary | ICD-10-CM

## 2023-02-06 DIAGNOSIS — R7303 Prediabetes: Secondary | ICD-10-CM

## 2023-02-06 DIAGNOSIS — R1010 Upper abdominal pain, unspecified: Secondary | ICD-10-CM

## 2023-02-06 LAB — COMPREHENSIVE METABOLIC PANEL
ALT: 17 U/L (ref 0–35)
AST: 17 U/L (ref 0–37)
Albumin: 4 g/dL (ref 3.5–5.2)
Alkaline Phosphatase: 96 U/L (ref 39–117)
BUN: 8 mg/dL (ref 6–23)
CO2: 29 mEq/L (ref 19–32)
Calcium: 9.7 mg/dL (ref 8.4–10.5)
Chloride: 102 mEq/L (ref 96–112)
Creatinine, Ser: 0.62 mg/dL (ref 0.40–1.20)
GFR: 95.44 mL/min (ref >60.00)
Glucose, Bld: 81 mg/dL (ref 70–99)
Potassium: 3.9 mEq/L (ref 3.5–5.1)
Sodium: 139 mEq/L (ref 135–145)
Total Bilirubin: 0.5 mg/dL (ref 0.2–1.2)
Total Protein: 7.7 g/dL (ref 6.0–8.3)

## 2023-02-06 LAB — CBC WITH DIFFERENTIAL/PLATELET
Basophils Absolute: 0.1 10*3/uL (ref 0.0–0.1)
Basophils Relative: 0.6 % (ref 0.0–3.0)
Eosinophils Absolute: 0.4 10*3/uL (ref 0.0–0.7)
Eosinophils Relative: 4.4 % (ref 0.0–5.0)
HCT: 38.8 % (ref 36.0–46.0)
Hemoglobin: 13.1 g/dL (ref 12.0–15.0)
Lymphocytes Relative: 35.3 % (ref 12.0–46.0)
Lymphs Abs: 3.3 10*3/uL (ref 0.7–4.0)
MCHC: 33.7 g/dL (ref 30.0–36.0)
MCV: 85.8 fl (ref 78.0–100.0)
Monocytes Absolute: 0.6 10*3/uL (ref 0.1–1.0)
Monocytes Relative: 6.9 % (ref 3.0–12.0)
Neutro Abs: 5 10*3/uL (ref 1.4–7.7)
Neutrophils Relative %: 52.8 % (ref 43.0–77.0)
Platelets: 357 10*3/uL (ref 150.0–400.0)
RBC: 4.52 Mil/uL (ref 3.87–5.11)
RDW: 13.9 % (ref 11.5–15.5)
WBC: 9.4 10*3/uL (ref 4.0–10.5)

## 2023-02-06 MED ORDER — PANTOPRAZOLE SODIUM 40 MG PO TBEC: 40.0000 mg | DELAYED_RELEASE_TABLET | Freq: Every day | ORAL | 3 refills | Status: DC

## 2023-02-06 MED ORDER — AMOXICILLIN-POT CLAVULANATE 875-125 MG PO TABS: 1.0000 | ORAL_TABLET | Freq: Two times a day (BID) | ORAL | 0 refills | Status: AC

## 2023-02-06 NOTE — Addendum Note (Signed)
Addended by: Davina Poke on: 02/06/2023 04:50 PM   Modules accepted: Level of Service

## 2023-02-06 NOTE — Assessment & Plan Note (Signed)
Stable.  Diet and nutrition discussed. 

## 2023-02-06 NOTE — Assessment & Plan Note (Signed)
Tender epigastric area on physical examination. May benefit from pantoprazole 40 mg daily for 2 to 4 weeks.

## 2023-02-06 NOTE — Assessment & Plan Note (Signed)
Probably recent bout of mild diverticulitis which responded to diet modification.  No significant tenderness on physical examination.

## 2023-02-06 NOTE — Progress Notes (Addendum)
Sheila [name] 62 y.o.   Chief Complaint  Patient presents with   Follow-up    F/u appt, patient having abd pain , patient states she has a hx diverticulitis     HISTORY OF PRESENT ILLNESS: This is a 62 y.o. female complaining of generalized abdominal pain on and off for the past several weeks. Has history of diverticulosis and frequent diverticulitis episodes Recently had pain to left lower abdomen which is now better but now it is more localized to the upper abdomen and left upper quadrant.  Occasional heartburn and nausea but no vomiting.  Also complaining of throat pain No other complaints or medical concerns today.  HPI   Prior to Admission medications   Medication Sig Start Date End Date Taking? Authorizing Provider  amLODipine (NORVASC) 5 MG tablet Take 1 tablet by mouth once daily 06/29/22  Yes Suezette Lafave, Friona, MD  Cyanocobalamin (B-12 PO) Take by mouth.   Yes [provider]    Allergies  Allergen Reactions   Ace Inhibitors Cough    Patient Active Problem List   Diagnosis Date Noted   Acute diverticulitis 11/22/2022   History of diverticulosis 11/22/2022   History of uterine fibroid 05/29/2017   Menopause 05/29/2017   Diverticulosis 02/18/2016   Dyslipidemia 06/25/2015   Prediabetes 06/25/2015   Primary hypertension 04/17/2012    Past Medical History:  Diagnosis Date   Diverticulitis    Fibroids    GERD (gastroesophageal reflux disease)    High cholesterol    Hyperlipidemia    Hypertension     Past Surgical History:  Procedure Laterality Date   BREAST BIOPSY Right 01/06/2004   HERNIA REPAIR     TUBAL LIGATION      Social History   Socioeconomic History   Marital status: Married    Spouse name: Not on file   Number of children: Not on file   Years of education: Not on file   Highest education level: Not on file  Occupational History   Not on file  Tobacco Use   Smoking status: Never   Smokeless tobacco: Never   Vaping Use   Vaping Use: Never used  Substance and Sexual Activity   Alcohol use: No   Drug use: No   Sexual activity: Yes    Partners: Male    Birth control/protection: Surgical, Post-menopausal    Comment: 1st intercourse- 17, partners- 2, btl  Other Topics Concern   Not on file  Social History Narrative   ** Merged History Encounter **       ** Merged History Encounter **       Social Determinants of Health   Financial Resource Strain: Not on file  Food Insecurity: No Food Insecurity (11/07/2022)   Hunger Vital Sign    Worried About Running Out of Food in the Last Year: Never true    Ran Out of Food in the Last Year: Never true  Transportation Needs: No Transportation Needs (11/07/2022)   PRAPARE - Hydrologist (Medical): No    Lack of Transportation (Non-Medical): No  Physical Activity: Not on file  Stress: Not on file  Social Connections: Not on file  Intimate Partner Violence: Not on file    Family History  Problem Relation Age of Onset   Stomach cancer Mother    Pneumonia Father    Diabetes Sister    Hypertension Sister    Hyperlipidemia Sister    Hypertension Sister    Hyperlipidemia Brother  Diabetes Brother      Review of Systems  Constitutional: Negative.  Negative for chills and fever.  HENT: Negative.    Respiratory: Negative.  Negative for cough and shortness of breath.   Cardiovascular: Negative.  Negative for chest pain and palpitations.  Gastrointestinal:  Positive for abdominal pain, heartburn and nausea. Negative for blood in stool, constipation, diarrhea and vomiting.  Genitourinary: Negative.  Negative for dysuria and hematuria.  Skin: Negative.  Negative for rash.  Neurological: Negative.  Negative for dizziness and headaches.  All other systems reviewed and are negative.  Today's Vitals   02/06/23 1444  BP: 136/86  Pulse: 75  Temp: 98.5 F (36.9 C)  TempSrc: Oral  SpO2: 96%  Weight: 175 lb 8 oz  (79.6 kg)  Height: 4' 11.5" (1.511 m)   Body mass index is 34.85 kg/m.   Physical Exam Vitals reviewed.  Constitutional:      Appearance: Normal appearance.  HENT:     Head: Normocephalic.     Mouth/Throat:     Pharynx: Oropharyngeal exudate (Right tonsillar area) and posterior oropharyngeal erythema present.  Eyes:     Extraocular Movements: Extraocular movements intact.     Conjunctiva/sclera: Conjunctivae normal.  Cardiovascular:     Rate and Rhythm: Normal rate and regular rhythm.     Pulses: Normal pulses.     Heart sounds: Normal heart sounds.  Pulmonary:     Effort: Pulmonary effort is normal.     Breath sounds: Normal breath sounds.  Abdominal:     Palpations: There is no mass.     Tenderness: There is abdominal tenderness (Upper abdomen and left upper quadrant). There is no guarding or rebound.  Musculoskeletal:     Cervical back: No tenderness.  Lymphadenopathy:     Cervical: Cervical adenopathy present.  Skin:    General: Skin is warm and dry.  Neurological:     General: No focal deficit present.     Mental Status: She is alert and oriented to person, place, and time.  Psychiatric:        Mood and Affect: Mood normal.        Behavior: Behavior normal.     Today's Vitals   02/06/23 1444  BP: 136/86  Pulse: 75  Temp: 98.5 F (36.9 C)  TempSrc: Oral  SpO2: 96%  Weight: 175 lb 8 oz (79.6 kg)  Height: 4' 11.5" (1.511 m)   Body mass index is 34.85 kg/m.  ASSESSMENT & PLAN: Problem List Items Addressed This Visit       Cardiovascular and Mediastinum   Primary hypertension    Well-controlled hypertension. Continue amlodipine 5 mg daily.        Respiratory   Tonsillitis    Recommend to start Augmentin 875 mg twice a day for 7 days      Relevant Medications   amoxicillin-clavulanate (AUGMENTIN) 875-125 MG tablet     Other   Prediabetes    Stable.  Diet and nutrition discussed.      Upper abdominal pain - Primary    Tender epigastric  area on physical examination. May benefit from pantoprazole 40 mg daily for 2 to 4 weeks.      Relevant Medications   pantoprazole (PROTONIX) 40 MG tablet   Other Relevant Orders   Comprehensive metabolic panel   CBC with Differential/Platelet   Urinalysis   History of diverticulitis    Probably recent bout of mild diverticulitis which responded to diet modification.  No significant tenderness on  physical examination.      Patient Instructions  Dolor abdominal en los adultos Abdominal Pain, Adult El dolor de [place] (abdominal) puede tener muchas causas. [place] veces, el dolor de [place] no es peligroso. Muchos de [employer] de dolor de estmago pueden controlarse y tratarse en casa. Sin embargo, a Clinical cytogeneticist, Conservation officer, historic buildings de [place] es grave. El mdico intentar descubrir la causa del dolor de [place]. Siga estas instrucciones en su casa:  Medicamentos [employer] de venta libre y los recetados solamente como se lo haya indicado el mdico. No tome medicamentos que lo ayuden a Landscape architect (laxantes), salvo que el mdico se lo indique. Instrucciones generales Est atento al dolor de estmago para Actuary cambio. Beba suficiente lquido para Contractor pis (la orina) de color amarillo plido. Concurra a todas las visitas de seguimiento como se lo haya indicado el mdico. Esto es importante. Comunquese con un mdico si: El dolor de estmago cambia o [place]. No tiene apetito o baja de peso sin proponrselo. Tiene dificultades para defecar (est estreido) o heces lquidas (diarrea) durante ms de 2 o 3 das. Siente dolor al orinar o defecar. El dolor de estmago lo despierta de noche. El dolor empeora con las comidas, despus de comer o con determinados alimentos. Tiene vmitos y no puede retener nada de lo que ingiere. Tiene fiebre. Observa sangre en la orina. Solicite ayuda de inmediato si: El dolor no desaparece en el tiempo indicado por el mdico. No  puede dejar de vomitar. Siente dolor solamente en zonas especficas del abdomen, como el lado derecho o la parte inferior izquierda. Tiene heces con sangre, de color negro o con aspecto alquitranado. Tiene dolor muy intenso en el vientre, clicos o meteorismo. Presenta signos de no tener suficientes lquidos o agua en el cuerpo (deshidratacin), por ejemplo: [place], muy escasa o falta de orina. Labios agrietados. Sequedad de boca. Ojos hundidos. Somnolencia. Debilidad. Tiene dificultad para respirar o Tourist information centre manager. Resumen Muchos de [employer] de dolor de estmago pueden controlarse y tratarse en casa. Est atento al dolor de estmago para Actuary cambio. [place] los medicamentos de venta libre y los recetados solamente como se lo haya indicado el mdico. Comunquese con un mdico si el dolor de estmago cambia o [place]. [place] ayuda de inmediato si tiene dolor muy intenso en el vientre, clicos o meteorismo. Esta informacin no tiene Marine scientist el consejo del mdico. Asegrese de hacerle al mdico cualquier pregunta que tenga. Document Revised: 05/28/2019 Document Reviewed: 05/28/2019 Elsevier Patient Education  [place], MD [facility]

## 2023-02-06 NOTE — Assessment & Plan Note (Signed)
Well-controlled hypertension. Continue amlodipine 5 mg daily.

## 2023-02-06 NOTE — Assessment & Plan Note (Signed)
Recommend to start Augmentin 875 mg twice a day for 7 days

## 2023-02-06 NOTE — Patient Instructions (Signed)
Dolor abdominal en los adultos Abdominal Pain, Adult El dolor de [place] (abdominal) puede tener muchas causas. [place] veces, el dolor de [place] no es peligroso. Muchos de [employer] de dolor de estmago pueden controlarse y tratarse en casa. Sin embargo, a Clinical cytogeneticist, Conservation officer, historic buildings de [place] es grave. El mdico intentar descubrir la causa del dolor de [place]. Siga estas instrucciones en su casa:  Medicamentos [employer] de venta libre y los recetados solamente como se lo haya indicado el mdico. No tome medicamentos que lo ayuden a Landscape architect (laxantes), salvo que el mdico se lo indique. Instrucciones generales Est atento al dolor de estmago para Actuary cambio. Beba suficiente lquido para Contractor pis (la orina) de color amarillo plido. Concurra a todas las visitas de seguimiento como se lo haya indicado el mdico. Esto es importante. Comunquese con un mdico si: El dolor de estmago cambia o [place]. No tiene apetito o baja de peso sin proponrselo. Tiene dificultades para defecar (est estreido) o heces lquidas (diarrea) durante ms de 2 o 3 das. Siente dolor al orinar o defecar. El dolor de estmago lo despierta de noche. El dolor empeora con las comidas, despus de comer o con determinados alimentos. Tiene vmitos y no puede retener nada de lo que ingiere. Tiene fiebre. Observa sangre en la orina. Solicite ayuda de inmediato si: El dolor no desaparece en el tiempo indicado por el mdico. No puede dejar de vomitar. Siente dolor solamente en zonas especficas del abdomen, como el lado derecho o la parte inferior izquierda. Tiene heces con sangre, de color negro o con aspecto alquitranado. Tiene dolor muy intenso en el vientre, clicos o meteorismo. Presenta signos de no tener suficientes lquidos o agua en el cuerpo (deshidratacin), por ejemplo: [place] Else, muy escasa o falta de orina. Labios agrietados. Sequedad de boca. Ojos  hundidos. Somnolencia. Debilidad. Tiene dificultad para respirar o Tourist information centre manager. Resumen Muchos de [employer] de dolor de estmago pueden controlarse y tratarse en casa. Est atento al dolor de estmago para Actuary cambio. Tome los medicamentos de venta libre y los recetados solamente como se lo haya indicado el mdico. Comunquese con un mdico si el dolor de estmago cambia o [place]. [place] ayuda de inmediato si tiene dolor muy intenso en el vientre, clicos o meteorismo. Esta informacin no tiene Marine scientist el consejo del mdico. Asegrese de hacerle al mdico cualquier pregunta que tenga. Document Revised: 05/28/2019 Document Reviewed: 05/28/2019 Elsevier Patient Education  [place].

## 2023-02-07 LAB — URINALYSIS, ROUTINE W REFLEX MICROSCOPIC
Bilirubin Urine: NEGATIVE
Ketones, ur: NEGATIVE
Nitrite: NEGATIVE
Specific Gravity, Urine: 1.01 (ref 1.000–1.030)
Total Protein, Urine: NEGATIVE
Urine Glucose: NEGATIVE
Urobilinogen, UA: 0.2 (ref 0.0–1.0)
pH: 7 (ref 5.0–8.0)

## 2023-02-09 ENCOUNTER — Encounter: Payer: Self-pay | Admitting: Emergency Medicine

## 2023-02-20 NOTE — Telephone Encounter (Signed)
Patient was informed with result note 01/31/23.

## 2023-03-24 ENCOUNTER — Ambulatory Visit (INDEPENDENT_AMBULATORY_CARE_PROVIDER_SITE_OTHER): Payer: Self-pay

## 2023-03-24 ENCOUNTER — Ambulatory Visit
Admission: EM | Admit: 2023-03-24 | Discharge: 2023-03-24 | Disposition: A | Discharge status: Home / Self Care | Payer: Self-pay | Attending: Internal Medicine | Admitting: Internal Medicine

## 2023-03-24 ENCOUNTER — Other Ambulatory Visit: Payer: Self-pay

## 2023-03-24 ENCOUNTER — Encounter: Payer: Self-pay | Admitting: *Deleted

## 2023-03-24 DIAGNOSIS — J069 Acute upper respiratory infection, unspecified: Secondary | ICD-10-CM

## 2023-03-24 LAB — POCT INFLUENZA A/B
Influenza A, POC: NEGATIVE
Influenza B, POC: NEGATIVE

## 2023-03-24 LAB — POCT RAPID STREP A (OFFICE): Rapid Strep A Screen: NEGATIVE

## 2023-03-24 MED ORDER — IBUPROFEN 600 MG PO TABS: 600.0000 mg | ORAL_TABLET | Freq: Four times a day (QID) | ORAL | 0 refills | Status: AC | PRN

## 2023-03-24 MED ORDER — BENZONATATE 200 MG PO CAPS: 200.0000 mg | ORAL_CAPSULE | Freq: Three times a day (TID) | ORAL | 0 refills | Status: DC | PRN

## 2023-03-24 NOTE — Discharge Instructions (Signed)
Please maintain adequate hydration Take medications as prescribed Chest x-ray is negative for pneumonia Will call you with recommendations if labs are abnormal Return to urgent care if symptoms persist or worsens.

## 2023-03-24 NOTE — ED Triage Notes (Addendum)
Via [employer] video interpreter: Pt c/o cough, HA, fever up to 102, and chest pain only when coughing x 2 days. Also started with right ear pain and throat pain. Has been taking Tyl - last dose @ 0600. Pt's daughter has tested positive for influenza.

## 2023-03-24 NOTE — ED Provider Notes (Signed)
[facility]    CSN: [id number] Arrival date & time: 03/24/23  1022      History   Chief Complaint Chief Complaint  Patient presents with   Cough   Fever    HPI Illiana Losurdo is a 62 y.o. female comes to urgent care with cough, fever of 102 Fahrenheit and chest pain associated with coughing.  Patient's symptoms started 2 days ago and it has been persistent.  She denies shortness of breath or wheezing on ambulation or at rest.  She also complains of left ear pain and sore throat.  No dizziness, near syncope or syncopal episodes.  Chest pain does not radiate to shoulders or back.  No nausea, vomiting or diarrhea.  Patient's daughter was recently diagnosed with "flu".  No abdominal pain.  Patient has taken Tylenol with no improvement in her symptoms.   HPI  Past Medical History:  Diagnosis Date   Diverticulitis    Fibroids    GERD (gastroesophageal reflux disease)    High cholesterol    Hyperlipidemia    Hypertension     Patient Active Problem List   Diagnosis Date Noted   Tonsillitis 02/06/2023   Acute diverticulitis 11/22/2022   History of diverticulitis 11/22/2022   History of uterine fibroid 05/29/2017   Menopause 05/29/2017   Diverticulosis 02/18/2016   Upper abdominal pain 01/31/2016   Dyslipidemia 06/25/2015   Prediabetes 06/25/2015   Primary hypertension 04/17/2012    Past Surgical History:  Procedure Laterality Date   BREAST BIOPSY Right 01/06/2004   HERNIA REPAIR     TUBAL LIGATION      OB History     Gravida  5   Para  3   Term  3   Preterm  0   AB  2   Living  3      SAB  2   IAB  0   Ectopic  0   Multiple      Live Births               Home Medications    Prior to Admission medications   Medication Sig Start Date End Date Taking? Authorizing Provider  amLODipine (NORVASC) 5 MG tablet Take 1 tablet by mouth once daily 06/29/22  Yes Sagardia, Eilleen Kempf, MD  benzonatate (TESSALON) 200 MG capsule  Take 1 capsule (200 mg total) by mouth 3 (three) times daily as needed for cough. 03/24/23  Yes Avyay Coger, Britta Mccreedy, MD  Cyanocobalamin (B-12 PO) Take by mouth.   Yes [provider]  ibuprofen (ADVIL) 600 MG tablet Take 1 tablet (600 mg total) by mouth every 6 (six) hours as needed. 03/24/23  Yes Rori Goar, Britta Mccreedy, MD  pantoprazole (PROTONIX) 40 MG tablet Take 1 tablet (40 mg total) by mouth daily. 02/06/23  Yes Sagardia, Eilleen Kempf, MD    Family History Family History  Problem Relation Age of Onset   Stomach cancer Mother    Pneumonia Father    Diabetes Sister    Hypertension Sister    Hyperlipidemia Sister    Hypertension Sister    Hyperlipidemia Brother    Diabetes Brother     Social History Social History   Tobacco Use   Smoking status: Never   Smokeless tobacco: Never  Vaping Use   Vaping Use: Never used  Substance Use Topics   Alcohol use: No   Drug use: No     Allergies   Ace inhibitors   Review of Systems Review of Systems  Constitutional: Negative.   HENT:  Positive for congestion and sore throat. Negative for postnasal drip and voice change.   Respiratory:  Positive for cough. Negative for chest tightness and wheezing.   Cardiovascular: Negative.   Gastrointestinal: Negative.      Physical Exam Triage Vital Signs ED Triage Vitals  Enc Vitals Group     BP 03/24/23 1029 123/84     Pulse Rate 03/24/23 1029 (!) 112     Resp 03/24/23 1029 16     Temp 03/24/23 1029 99 F (37.2 C)     Temp Source 03/24/23 1029 Oral     SpO2 03/24/23 1029 94 %     Weight --      Height --      Head Circumference --      Peak Flow --      Pain Score 03/24/23 1031 6     Pain Loc --      Pain Edu? --      Excl. in GC? --    No data found.  Updated Vital Signs BP 123/84   Pulse (!) 112   Temp 99 F (37.2 C) (Oral)   Resp 16   LMP 05/08/2017 (Approximate)   SpO2 94%   Visual Acuity Right Eye Distance:   Left Eye Distance:   Bilateral Distance:     Right Eye Near:   Left Eye Near:    Bilateral Near:     Physical Exam Vitals and nursing note reviewed.  Constitutional:      General: She is not in acute distress.    Appearance: Normal appearance. She is ill-appearing.  HENT:     Right Ear: Tympanic membrane normal.     Left Ear: Tympanic membrane normal.     Mouth/Throat:     Mouth: Mucous membranes are moist.  Cardiovascular:     Rate and Rhythm: Normal rate and regular rhythm.     Pulses: Normal pulses.     Heart sounds: Normal heart sounds.  Pulmonary:     Effort: Pulmonary effort is normal.     Breath sounds: Rales present.  Abdominal:     General: Bowel sounds are normal.     Palpations: Abdomen is soft.  Neurological:     Mental Status: She is alert.      UC Treatments / Results  Labs (all labs ordered are listed, but only abnormal results are displayed) Labs Reviewed  POCT INFLUENZA A/B  POCT RAPID STREP A (OFFICE)    EKG   Radiology DG Chest 2 View  Result Date: 03/24/2023 CLINICAL DATA:  Cough, headache and fever. EXAM: CHEST - 2 VIEW COMPARISON:  12/26/2020 chest x-ray and chest CT. FINDINGS: The cardiac silhouette, mediastinal and hilar contours are within normal limits and stable. Chronic bronchitic and post pneumonic type scarring changes in the lungs but no acute pulmonary process. No pleural effusions or pulmonary lesions. The bony thorax is intact. IMPRESSION: Chronic lung changes but no acute overlying pulmonary process. Electronically Signed   By: Rudie Meyer M.D.   On: 03/24/2023 13:02    Procedures Procedures (including critical care time)  Medications Ordered in UC Medications - No data to display  Initial Impression / Assessment and Plan / UC Course  I have reviewed the triage vital signs and the nursing notes.  Pertinent labs & imaging results that were available during my care of the patient were reviewed by me and considered in my medical decision making (see chart for  details).  1.  Viral URI with cough: Chest x-ray is negative for acute lung infiltrates Point-of-care strep test is negative Point-of-care flu test is negative Throat cultures have been sent Tessalon Perles as needed for cough Ibuprofen as needed for pain and/or/fever Return precautions given. Final Clinical Impressions(s) / UC Diagnoses   Final diagnoses:  Viral URI with cough     Discharge Instructions      Please maintain adequate hydration Take medications as prescribed Chest x-ray is negative for pneumonia Will call you with recommendations if labs are abnormal Return to urgent care if symptoms persist or worsens.   ED Prescriptions     Medication Sig Dispense Auth. Provider   benzonatate (TESSALON) 200 MG capsule Take 1 capsule (200 mg total) by mouth 3 (three) times daily as needed for cough. 21 capsule Kimani Bedoya, Britta Mccreedy, MD   ibuprofen (ADVIL) 600 MG tablet Take 1 tablet (600 mg total) by mouth every 6 (six) hours as needed. 30 tablet Aashir Umholtz, Britta Mccreedy, MD      PDMP not reviewed this encounter.   Merrilee Jansky, MD 03/24/23 [phone]

## 2023-07-06 ENCOUNTER — Telehealth: Payer: Self-pay | Admitting: Emergency Medicine

## 2023-07-06 DIAGNOSIS — I10 Essential (primary) hypertension: Secondary | ICD-10-CM

## 2023-07-06 MED ORDER — AMLODIPINE BESYLATE 5 MG PO TABS
5.0000 mg | ORAL_TABLET | Freq: Every day | ORAL | 1 refills | Status: DC
Start: 2023-07-06 — End: 2024-07-23

## 2023-07-06 NOTE — Telephone Encounter (Signed)
Prescription Request  07/06/2023  LOV: 02/06/2023  What is the name of the medication or equipment? amlodopine  Have you contacted your pharmacy to request a refill? Yes   Which pharmacy would you like this sent to?  [facility] [address] [place] - [address] [place] [address] Phone: [phone] Fax: [phone]    Patient notified that their request is being sent to the clinical staff for review and that they should receive a response within 2 business days.   Please advise at Mobile [phone] (mobile)

## 2023-07-06 NOTE — Telephone Encounter (Signed)
New medication sent

## 2023-07-11 ENCOUNTER — Encounter: Payer: Self-pay | Admitting: Emergency Medicine

## 2023-07-11 ENCOUNTER — Ambulatory Visit (INDEPENDENT_AMBULATORY_CARE_PROVIDER_SITE_OTHER): Payer: Self-pay

## 2023-07-11 ENCOUNTER — Ambulatory Visit (INDEPENDENT_AMBULATORY_CARE_PROVIDER_SITE_OTHER): Payer: Self-pay | Admitting: Emergency Medicine

## 2023-07-11 VITALS — BP 136/88 | HR 93 | Temp 98.4°F | Ht 59.5 in | Wt 181.0 lb

## 2023-07-11 DIAGNOSIS — R5383 Other fatigue: Secondary | ICD-10-CM

## 2023-07-11 DIAGNOSIS — I10 Essential (primary) hypertension: Secondary | ICD-10-CM

## 2023-07-11 LAB — COMPREHENSIVE METABOLIC PANEL
ALT: 20 U/L (ref 0–35)
AST: 19 U/L (ref 0–37)
Albumin: 4.4 g/dL (ref 3.5–5.2)
Alkaline Phosphatase: 95 U/L (ref 39–117)
BUN: 13 mg/dL (ref 6–23)
CO2: 29 mEq/L (ref 19–32)
Calcium: 9.5 mg/dL (ref 8.4–10.5)
Chloride: 103 mEq/L (ref 96–112)
Creatinine, Ser: 0.66 mg/dL (ref 0.40–1.20)
GFR: 93.74 mL/min (ref >60.00)
Glucose, Bld: 124 mg/dL — ABNORMAL HIGH (ref 70–99)
Potassium: 3.6 mEq/L (ref 3.5–5.1)
Sodium: 140 mEq/L (ref 135–145)
Total Bilirubin: 0.4 mg/dL (ref 0.2–1.2)
Total Protein: 7.7 g/dL (ref 6.0–8.3)

## 2023-07-11 LAB — URINALYSIS, ROUTINE W REFLEX MICROSCOPIC
Bilirubin Urine: NEGATIVE
Hgb urine dipstick: NEGATIVE
Ketones, ur: NEGATIVE
Nitrite: NEGATIVE
Specific Gravity, Urine: 1.02 (ref 1.000–1.030)
Total Protein, Urine: NEGATIVE
Urine Glucose: NEGATIVE
Urobilinogen, UA: 0.2 (ref 0.0–1.0)
pH: 6.5 (ref 5.0–8.0)

## 2023-07-11 LAB — CBC WITH DIFFERENTIAL/PLATELET
Basophils Absolute: 0.1 10*3/uL (ref 0.0–0.1)
Basophils Relative: 0.8 % (ref 0.0–3.0)
Eosinophils Absolute: 0.4 10*3/uL (ref 0.0–0.7)
Eosinophils Relative: 3.7 % (ref 0.0–5.0)
HCT: 38.8 % (ref 36.0–46.0)
Hemoglobin: 12.7 g/dL (ref 12.0–15.0)
Lymphocytes Relative: 25.4 % (ref 12.0–46.0)
Lymphs Abs: 2.7 10*3/uL (ref 0.7–4.0)
MCHC: 32.7 g/dL (ref 30.0–36.0)
MCV: 87.7 fl (ref 78.0–100.0)
Monocytes Absolute: 0.6 10*3/uL (ref 0.1–1.0)
Monocytes Relative: 5.3 % (ref 3.0–12.0)
Neutro Abs: 6.9 10*3/uL (ref 1.4–7.7)
Neutrophils Relative %: 64.8 % (ref 43.0–77.0)
Platelets: 331 10*3/uL (ref 150.0–400.0)
RBC: 4.43 Mil/uL (ref 3.87–5.11)
RDW: 14.1 % (ref 11.5–15.5)
WBC: 10.7 10*3/uL — ABNORMAL HIGH (ref 4.0–10.5)

## 2023-07-11 LAB — VITAMIN D 25 HYDROXY (VIT D DEFICIENCY, FRACTURES): VITD: 26.6 ng/mL — ABNORMAL LOW (ref 30.00–100.00)

## 2023-07-11 LAB — VITAMIN B12: Vitamin B-12: 456 pg/mL (ref 211–911)

## 2023-07-11 LAB — LIPID PANEL
Cholesterol: 236 mg/dL — ABNORMAL HIGH (ref 0–200)
HDL: 54.1 mg/dL (ref >39.00)
LDL Cholesterol: 144 mg/dL — ABNORMAL HIGH (ref 0–99)
NonHDL: 182.34
Total CHOL/HDL Ratio: 4
Triglycerides: 194 mg/dL — ABNORMAL HIGH (ref 0.0–149.0)
VLDL: 38.8 mg/dL (ref 0.0–40.0)

## 2023-07-11 LAB — TSH: TSH: 1.62 u[IU]/mL (ref 0.35–5.50)

## 2023-07-11 LAB — HEMOGLOBIN A1C: Hgb A1c MFr Bld: 5.9 % (ref 4.6–6.5)

## 2023-07-11 NOTE — Patient Instructions (Signed)
Fatiga Fatigue Si siente fatiga, tiene una sensacin de cansancio en todo momento y falta de energa o falta de motivacin. La fatiga puede dificultar el inicio o la realizacin de tareas debido al agotamiento. La fatiga ocasional o leve con frecuencia es una reaccin normal a la actividad o la vida. Sin embargo, la fatiga de Set designer duracin (crnica) o extrema puede ser sntoma de una afeccin mdica, por ejemplo: Depresin. No tener suficientes glbulos rojos o hemoglobina en la sangre (anemia). Un problema con Neomia Dear glndula pequea ubicada en la parte inferior delantera del cuello (trastorno tiroideo). Afecciones reumatolgicas. Estos son problemas relacionados con el sistema de defensa del cuerpo (Con el transcurso del [place], la fatiga tambin puede tener consecuencias negativas en la salud. Siga estas instrucciones en su casa: Medicamentos Use los medicamentos de venta libre y los recetados solamente como se lo haya indicado el mdico. Tome una multivitamina, si se lo indic el mdico. No tome ningn complemento alimenticio o a base de hierbas a menos que lo haya autorizado el mdico. Comida y bebida  Evite las comidas abundantes a la noche. Consuma una dieta bien balanceada que incluya protenas magras, cereales integrales, abundantes frutas, verduras y productos lcteos descremados. Evite comer o beber demasiados productos que contengan cafena. Evite tomar alcohol. Beber suficiente lquido como para Pharmacologist la orina de color amarillo plido. Actividad  Realice ejercicio con regularidad como se lo haya indicado el mdico. Practique tcnicas que lo ayuden a Lexicographer, como yoga, tai chi, meditacin, masoterapia. Estilo de [employer] las situaciones que le provocan estrs. Trate de que sus horarios de [place] y personal estn equilibrados. No use drogas ilegales ni recreativas. Instrucciones generales Controle su fatiga para ver si hay cambios. Acustese y levntese a la misma hora  todos [place]. Evite la fatiga moderando su ritmo durante el da y durmiendo lo suficiente durante la noche. Mantenga un peso saludable. Comunquese con un mdico si: La fatiga no mejora. Tiene fiebre. Pierde peso o [place] de peso de forma repentina. Tiene dolores de [place]. Tiene problemas para conciliar el sueo o para dormir en la noche. Se siente enojado, culpable, ansioso o triste. Observa hinchazn en las piernas u otras partes del cuerpo. Solicite ayuda de inmediato si: Se siente confundido, siente que va a desmayarse o se desmaya. Tiene la visin borrosa o tiene dolor de cabeza intenso. Siente dolor intenso en el abdomen, la espalda o el rea entre la cintura y las caderas (pelvis). Tiene dolor de pecho, dificultad para respirar, o latidos cardacos irregulares o acelerados. No puede orinar u [employer] de lo normal. Presenta sangrado anormal del recto, la nariz, los pulmones, los pezones o, si es [place], de la vagina. Vomita sangre. Tiene pensamientos acerca de Radiographer, therapeutic a Economist. Estos sntomas pueden Customer service manager. Solicite ayuda de inmediato. Llame al 911. No espere a ver si los sntomas desaparecen. No conduzca por sus propios medios [employer]. Busque ayuda de inmediato si alguna vez siente que puede hacerse dao a usted mismo o a otros, o tiene pensamientos de Patent examiner a su vida. Dirjase al centro de urgencias ms cercano o: Llame al 911. Llame a National Suicide Prevention Lifeline (Lnea Telefnica Nacional para la Prevencin del Suicidio) al [phone] o al 988. Est disponible las 24 horas del da. Enve un mensaje de texto a la lnea para casos de crisis al [phone]. Resumen Si siente fatiga, tiene una sensacin de cansancio en todo momento y falta de energa o falta  de motivacin. La fatiga puede dificultar el inicio o la realizacin de tareas debido al agotamiento. La fatiga de larga duracin (crnica) o extrema puede ser  sntoma de una afeccin mdica. Realice ejercicio con regularidad como se lo haya indicado el mdico. Cambie las situaciones que le provocan estrs. Trate de que sus horarios de [place] y personal estn equilibrados. Esta informacin no tiene Theme park manager el consejo del mdico. Asegrese de hacerle al mdico cualquier pregunta que tenga. Document Revised: 09/29/2021 Document Reviewed: 09/29/2021 Elsevier Patient Education  2024 [employer].

## 2023-07-11 NOTE — Assessment & Plan Note (Signed)
Clinically stable.  Differential diagnosis discussed. Most likely secondary to increased stress at home Blood work done today.

## 2023-07-11 NOTE — Assessment & Plan Note (Signed)
Clinically stable.  No red flag signs or symptoms. Differential diagnosis discussed. Most likely related to lack of sleep and rest Increased stress in her life Blood work done today

## 2023-07-11 NOTE — Assessment & Plan Note (Signed)
BP Readings from Last 3 Encounters:  07/11/23 136/88  03/24/23 123/84  02/06/23 136/86  Well controlled hypertension Continue amlodipine 5 mg daily

## 2023-07-11 NOTE — Progress Notes (Signed)
Malakai Beaton 62 y.o.   Chief Complaint  Patient presents with   Hypertension    Patient states sometimes her BP is high,     HISTORY OF PRESENT ILLNESS: This is a 62 y.o. female A1A complaining of feeling tired and fatigue for the past month. History of hypertension.  Concerned blood pressure might be high.  Does not check blood pressure readings at home. No new medications. However new source of stress at home.  Taking care of her 53-year-old granddaughter.  Not sleeping well.  Decreased energy.  Stressing her out. No other associated symptoms No other complaints or medical concerns today. BP Readings from Last 3 Encounters:  07/11/23 136/88  03/24/23 123/84  02/06/23 136/86     Hypertension Associated symptoms include malaise/fatigue. Pertinent negatives include no chest pain, headaches, palpitations or shortness of breath.     Prior to Admission medications   Medication Sig Start Date End Date Taking? Authorizing Provider  amLODipine (NORVASC) 5 MG tablet Take 1 tablet (5 mg total) by mouth daily. 07/06/23  Yes Travonte Byard, Eilleen Kempf, MD  Cyanocobalamin (B-12 PO) Take by mouth. Patient not taking: Reported on 07/11/2023    [provider]  ibuprofen (ADVIL) 600 MG tablet Take 1 tablet (600 mg total) by mouth every 6 (six) hours as needed. Patient not taking: Reported on 07/11/2023 03/24/23   Merrilee Jansky, MD  pantoprazole (PROTONIX) 40 MG tablet Take 1 tablet (40 mg total) by mouth daily. Patient not taking: Reported on 07/11/2023 02/06/23   Georgina Quint, MD    Allergies  Allergen Reactions   Ace Inhibitors Cough    Patient Active Problem List   Diagnosis Date Noted   History of diverticulitis 11/22/2022   History of uterine fibroid 05/29/2017   Menopause 05/29/2017   Diverticulosis 02/18/2016   Dyslipidemia 06/25/2015   Prediabetes 06/25/2015   Primary hypertension 04/17/2012    Past Medical History:  Diagnosis Date   Diverticulitis     Fibroids    GERD (gastroesophageal reflux disease)    High cholesterol    Hyperlipidemia    Hypertension     Past Surgical History:  Procedure Laterality Date   BREAST BIOPSY Right 01/06/2004   HERNIA REPAIR     TUBAL LIGATION      Social History   Socioeconomic History   Marital status: Married    Spouse name: Not on file   Number of children: Not on file   Years of education: Not on file   Highest education level: Not on file  Occupational History   Not on file  Tobacco Use   Smoking status: Never   Smokeless tobacco: Never  Vaping Use   Vaping status: Never Used  Substance and Sexual Activity   Alcohol use: No   Drug use: No   Sexual activity: Yes    Partners: Male    Birth control/protection: Surgical    Comment: 1st intercourse- 17, partners- 2, btl  Other Topics Concern   Not on file  Social History Narrative   ** Merged History Encounter **       ** Merged History Encounter **       Social Determinants of Health   Financial Resource Strain: Not on file  Food Insecurity: No Food Insecurity (11/07/2022)   Hunger Vital Sign    Worried About Running Out of Food in the Last Year: Never true    Ran Out of Food in the Last Year: Never true  Transportation Needs: No Transportation  Needs (11/07/2022)   PRAPARE - Administrator, Civil Service (Medical): No    Lack of Transportation (Non-Medical): No  Physical Activity: Not on file  Stress: Not on file  Social Connections: Not on file  Intimate Partner Violence: Not on file    Family History  Problem Relation Age of Onset   Stomach cancer Mother    Pneumonia Father    Diabetes Sister    Hypertension Sister    Hyperlipidemia Sister    Hypertension Sister    Hyperlipidemia Brother    Diabetes Brother      Review of Systems  Constitutional:  Positive for malaise/fatigue. Negative for chills and fever.  HENT: Negative.  Negative for congestion and sore throat.   Respiratory: Negative.   Negative for cough and shortness of breath.   Cardiovascular:  Negative for chest pain and palpitations.  Gastrointestinal:  Negative for abdominal pain, nausea and vomiting.  Genitourinary: Negative.  Negative for dysuria and hematuria.  Skin: Negative.  Negative for rash.  Neurological: Negative.  Negative for dizziness and headaches.  All other systems reviewed and are negative.   Vitals:   07/11/23 1359  BP: 136/88  Pulse: 93  Temp: 98.4 F (36.9 C)  SpO2: 95%    Physical Exam Vitals reviewed.  Constitutional:      Appearance: Normal appearance.  HENT:     Head: Normocephalic.     Mouth/Throat:     Mouth: Mucous membranes are moist.     Pharynx: Oropharynx is clear.  Eyes:     Extraocular Movements: Extraocular movements intact.     Conjunctiva/sclera: Conjunctivae normal.     Pupils: Pupils are equal, round, and reactive to light.  Cardiovascular:     Rate and Rhythm: Normal rate and regular rhythm.     Pulses: Normal pulses.     Heart sounds: Normal heart sounds.  Pulmonary:     Effort: Pulmonary effort is normal.     Breath sounds: Normal breath sounds.  Abdominal:     Palpations: Abdomen is soft.     Tenderness: There is no abdominal tenderness.  Musculoskeletal:     Cervical back: No tenderness.  Lymphadenopathy:     Cervical: No cervical adenopathy.  Skin:    General: Skin is warm and dry.     Capillary Refill: Capillary refill takes less than 2 seconds.  Neurological:     General: No focal deficit present.     Mental Status: She is alert and oriented to person, place, and time.  Psychiatric:        Mood and Affect: Mood normal.        Behavior: Behavior normal.      ASSESSMENT & PLAN: A total of 44 minutes was spent with the patient and counseling/coordination of care regarding preparing for this visit, review of most recent office visit notes, review of chronic medical conditions under management, review of all medications, cardiovascular risks  associated with hypertension, differential diagnosis of fatigue and malaise and need for workup, education on nutrition, stress management, prognosis, documentation, and need for follow-up.  Problem List Items Addressed This Visit       Cardiovascular and Mediastinum   Primary hypertension    BP Readings from Last 3 Encounters:  07/11/23 136/88  03/24/23 123/84  02/06/23 136/86  Well controlled hypertension Continue amlodipine 5 mg daily          Other   Fatigue - Primary    Clinically stable.  No red  flag signs or symptoms. Differential diagnosis discussed. Most likely related to lack of sleep and rest Increased stress in her life Blood work done today      Relevant Orders   Urinalysis   Comprehensive metabolic panel   CBC with Differential/Platelet   Hemoglobin A1c   TSH   Lipid panel   Vitamin B12   VITAMIN D 25 Hydroxy (Vit-D Deficiency, Fractures)   DG Chest 2 View   Tiredness    Clinically stable.  Differential diagnosis discussed. Most likely secondary to increased stress at home Blood work done today.      Relevant Orders   Urinalysis   Comprehensive metabolic panel   CBC with Differential/Platelet   Hemoglobin A1c   TSH   Lipid panel   Vitamin B12   VITAMIN D 25 Hydroxy (Vit-D Deficiency, Fractures)   DG Chest 2 View   Patient Instructions  Fatiga Fatigue Si siente fatiga, tiene una sensacin de cansancio en todo momento y falta de energa o falta de motivacin. La fatiga puede dificultar el inicio o la realizacin de tareas debido al agotamiento. La fatiga ocasional o leve con frecuencia es una reaccin normal a la actividad o la vida. Sin embargo, la fatiga de Set designer duracin (crnica) o extrema puede ser sntoma de una afeccin mdica, por ejemplo: Depresin. No tener suficientes glbulos rojos o hemoglobina en la sangre (anemia). Un problema con Neomia Dear glndula pequea ubicada en la parte inferior delantera del cuello (trastorno  tiroideo). Afecciones reumatolgicas. Estos son problemas relacionados con el sistema de defensa del cuerpo (Con el transcurso del [place], la fatiga tambin puede tener consecuencias negativas en la salud. Siga estas instrucciones en su casa: Medicamentos Use los medicamentos de venta libre y los recetados solamente como se lo haya indicado el mdico. Tome una multivitamina, si se lo indic el mdico. No tome ningn complemento alimenticio o a base de hierbas a menos que lo haya autorizado el mdico. Comida y bebida  Evite las comidas abundantes a la noche. Consuma una dieta bien balanceada que incluya protenas magras, cereales integrales, abundantes frutas, verduras y productos lcteos descremados. Evite comer o beber demasiados productos que contengan cafena. Evite tomar alcohol. Beber suficiente lquido como para Pharmacologist la orina de color amarillo plido. Actividad  Realice ejercicio con regularidad como se lo haya indicado el mdico. Practique tcnicas que lo ayuden a Lexicographer, como yoga, tai chi, meditacin, masoterapia. Estilo de [employer] las situaciones que le provocan estrs. Trate de que sus horarios de [place] y personal estn equilibrados. No use drogas ilegales ni recreativas. Instrucciones generales Controle su fatiga para ver si hay cambios. Acustese y levntese a la misma hora todos [place]. Evite la fatiga moderando su ritmo durante el da y durmiendo lo suficiente durante la noche. Mantenga un peso saludable. Comunquese con un mdico si: La fatiga no mejora. Tiene fiebre. Pierde peso o [place] de peso de forma repentina. Tiene dolores de [place]. Tiene problemas para conciliar el sueo o para dormir en la noche. Se siente enojado, culpable, ansioso o triste. Observa hinchazn en las piernas u otras partes del cuerpo. Solicite ayuda de inmediato si: Se siente confundido, siente que va a desmayarse o se desmaya. Tiene la visin borrosa o tiene dolor de  cabeza intenso. Siente dolor intenso en el abdomen, la espalda o el rea entre la cintura y las caderas (pelvis). Tiene dolor de pecho, dificultad para respirar, o latidos cardacos irregulares o acelerados. No puede orinar u orina menos de lo normal. Presenta  sangrado anormal del recto, la nariz, los pulmones, los pezones o, si es [place], de la vagina. Vomita sangre. Tiene pensamientos acerca de Radiographer, therapeutic a Economist. Estos sntomas pueden Customer service manager. Solicite ayuda de inmediato. Llame al 911. No espere a ver si los sntomas desaparecen. No conduzca por sus propios medios [employer]. Busque ayuda de inmediato si alguna vez siente que puede hacerse dao a usted mismo o a otros, o tiene pensamientos de Patent examiner a su vida. Dirjase al centro de urgencias ms cercano o: Llame al 911. Llame a National Suicide Prevention Lifeline (Lnea Telefnica Nacional para la Prevencin del Suicidio) al [phone] o al 988. Est disponible las 24 horas del da. Enve un mensaje de texto a la lnea para casos de crisis al [phone]. Resumen Si siente fatiga, tiene una sensacin de cansancio en todo momento y falta de energa o falta de motivacin. La fatiga puede dificultar el inicio o la realizacin de tareas debido al agotamiento. La fatiga de larga duracin (crnica) o extrema puede ser sntoma de una afeccin mdica. Realice ejercicio con regularidad como se lo haya indicado el mdico. Cambie las situaciones que le provocan estrs. Trate de que sus horarios de [place] y personal estn equilibrados. Esta informacin no tiene Theme park manager el consejo del mdico. Asegrese de hacerle al mdico cualquier pregunta que tenga. Document Revised: 09/29/2021 Document Reviewed: 09/29/2021 Elsevier Patient Education  2024 [employer] Inc[employer]    Edwina Barth, MD [facility]

## 2023-07-12 ENCOUNTER — Other Ambulatory Visit: Payer: Self-pay | Admitting: Emergency Medicine

## 2023-07-12 DIAGNOSIS — E785 Hyperlipidemia, unspecified: Secondary | ICD-10-CM

## 2023-07-12 MED ORDER — ROSUVASTATIN CALCIUM 10 MG PO TABS
10.0000 mg | ORAL_TABLET | Freq: Every day | ORAL | 3 refills | Status: DC
Start: 2023-07-12 — End: 2024-07-23

## 2023-10-18 ENCOUNTER — Encounter: Payer: Self-pay | Admitting: Emergency Medicine

## 2023-10-18 ENCOUNTER — Ambulatory Visit (INDEPENDENT_AMBULATORY_CARE_PROVIDER_SITE_OTHER): Payer: Self-pay

## 2023-10-18 ENCOUNTER — Ambulatory Visit (INDEPENDENT_AMBULATORY_CARE_PROVIDER_SITE_OTHER): Payer: Self-pay | Admitting: Emergency Medicine

## 2023-10-18 VITALS — BP 136/84 | HR 80 | Temp 98.4°F | Ht 59.5 in | Wt 180.4 lb

## 2023-10-18 DIAGNOSIS — R1084 Generalized abdominal pain: Secondary | ICD-10-CM

## 2023-10-18 DIAGNOSIS — Z8719 Personal history of other diseases of the digestive system: Secondary | ICD-10-CM

## 2023-10-18 LAB — COMPREHENSIVE METABOLIC PANEL
ALT: 19 U/L (ref 0–35)
AST: 19 U/L (ref 0–37)
Albumin: 4.4 g/dL (ref 3.5–5.2)
Alkaline Phosphatase: 102 U/L (ref 39–117)
BUN: 10 mg/dL (ref 6–23)
CO2: 27 meq/L (ref 19–32)
Calcium: 9.4 mg/dL (ref 8.4–10.5)
Chloride: 102 meq/L (ref 96–112)
Creatinine, Ser: 0.61 mg/dL (ref 0.40–1.20)
GFR: 95.35 mL/min (ref >60.00)
Glucose, Bld: 92 mg/dL (ref 70–99)
Potassium: 3.9 meq/L (ref 3.5–5.1)
Sodium: 139 meq/L (ref 135–145)
Total Bilirubin: 0.5 mg/dL (ref 0.2–1.2)
Total Protein: 7.9 g/dL (ref 6.0–8.3)

## 2023-10-18 LAB — CBC WITH DIFFERENTIAL/PLATELET
Basophils Absolute: 0 10*3/uL (ref 0.0–0.1)
Basophils Relative: 0.3 % (ref 0.0–3.0)
Eosinophils Absolute: 0.3 10*3/uL (ref 0.0–0.7)
Eosinophils Relative: 3.3 % (ref 0.0–5.0)
HCT: 40 % (ref 36.0–46.0)
Hemoglobin: 13 g/dL (ref 12.0–15.0)
Lymphocytes Relative: 30.3 % (ref 12.0–46.0)
Lymphs Abs: 2.5 10*3/uL (ref 0.7–4.0)
MCHC: 32.5 g/dL (ref 30.0–36.0)
MCV: 88.9 fL (ref 78.0–100.0)
Monocytes Absolute: 0.5 10*3/uL (ref 0.1–1.0)
Monocytes Relative: 5.5 % (ref 3.0–12.0)
Neutro Abs: 5 10*3/uL (ref 1.4–7.7)
Neutrophils Relative %: 60.6 % (ref 43.0–77.0)
Platelets: 300 10*3/uL (ref 150.0–400.0)
RBC: 4.5 Mil/uL (ref 3.87–5.11)
RDW: 13.9 % (ref 11.5–15.5)
WBC: 8.3 10*3/uL (ref 4.0–10.5)

## 2023-10-18 LAB — LIPASE: Lipase: 25 U/L (ref 11.0–59.0)

## 2023-10-18 MED ORDER — PANTOPRAZOLE SODIUM 40 MG PO TBEC
40.0000 mg | DELAYED_RELEASE_TABLET | Freq: Every day | ORAL | 3 refills | Status: DC
Start: 2023-10-18 — End: 2024-07-23

## 2023-10-18 MED ORDER — AMOXICILLIN-POT CLAVULANATE 875-125 MG PO TABS
1.0000 | ORAL_TABLET | Freq: Two times a day (BID) | ORAL | 0 refills | Status: AC
Start: 2023-10-18 — End: 2023-10-25

## 2023-10-18 NOTE — Progress Notes (Signed)
Sheila [name] 62 y.o.   Chief Complaint  Patient presents with   Abdominal Pain    Patient states she is having some upper abd pain, left side, patient thinks it is her diverticulitis.     HISTORY OF PRESENT ILLNESS: This is a 62 y.o. female complaining of left abdominal intermittent pain for the past week.  Has history of diverticulitis. Denies fever or chills.  Able to eat and drink.  Denies nausea or vomiting.  Moving bowels okay.  Denies constipation or diarrhea. Occasional cough.  Had 1 episode of blood in the spit last week. No other associated symptoms No other comp points or medical concerns today.  Abdominal Pain Pertinent negatives include no constipation, diarrhea, dysuria, fever, headaches, hematuria, nausea or vomiting.     Prior to Admission medications   Medication Sig Start Date End Date Taking? Authorizing Provider  amLODipine (NORVASC) 5 MG tablet Take 1 tablet (5 mg total) by mouth daily. 07/06/23  Yes Shamari Lofquist, Eilleen Kempf, MD  rosuvastatin (CRESTOR) 10 MG tablet Take 1 tablet (10 mg total) by mouth daily. 07/12/23  Yes Dori Devino, Eilleen Kempf, MD  Cyanocobalamin (B-12 PO) Take by mouth. Patient not taking: Reported on 07/11/2023    [provider]  ibuprofen (ADVIL) 600 MG tablet Take 1 tablet (600 mg total) by mouth every 6 (six) hours as needed. Patient not taking: Reported on 07/11/2023 03/24/23   Merrilee Jansky, MD  pantoprazole (PROTONIX) 40 MG tablet Take 1 tablet (40 mg total) by mouth daily. Patient not taking: Reported on 07/11/2023 02/06/23   Georgina Quint, MD    Allergies  Allergen Reactions   Ace Inhibitors Cough    Patient Active Problem List   Diagnosis Date Noted   History of diverticulitis 11/22/2022   History of uterine fibroid 05/29/2017   Menopause 05/29/2017   Diverticulosis 02/18/2016   Dyslipidemia 06/25/2015   Prediabetes 06/25/2015   Primary hypertension 04/17/2012    Past Medical History:  Diagnosis Date    Diverticulitis    Fibroids    GERD (gastroesophageal reflux disease)    High cholesterol    Hyperlipidemia    Hypertension     Past Surgical History:  Procedure Laterality Date   BREAST BIOPSY Right 01/06/2004   HERNIA REPAIR     TUBAL LIGATION      Social History   Socioeconomic History   Marital status: Married    Spouse name: Not on file   Number of children: Not on file   Years of education: Not on file   Highest education level: Not on file  Occupational History   Not on file  Tobacco Use   Smoking status: Never   Smokeless tobacco: Never  Vaping Use   Vaping status: Never Used  Substance and Sexual Activity   Alcohol use: No   Drug use: No   Sexual activity: Yes    Partners: Male    Birth control/protection: Surgical    Comment: 1st intercourse- 17, partners- 2, btl  Other Topics Concern   Not on file  Social History Narrative   ** Merged History Encounter **       ** Merged History Encounter **       Social Determinants of Health   Financial Resource Strain: Not on file  Food Insecurity: No Food Insecurity (11/07/2022)   Hunger Vital Sign    Worried About Running Out of Food in the Last Year: Never true    Ran Out of Food in the Last  Year: Never true  Transportation Needs: No Transportation Needs (11/07/2022)   PRAPARE - Administrator, Civil Service (Medical): No    Lack of Transportation (Non-Medical): No  Physical Activity: Not on file  Stress: Not on file  Social Connections: Not on file  Intimate Partner Violence: Not on file    Family History  Problem Relation Age of Onset   Stomach cancer Mother    Pneumonia Father    Diabetes Sister    Hypertension Sister    Hyperlipidemia Sister    Hypertension Sister    Hyperlipidemia Brother    Diabetes Brother      Review of Systems  Constitutional: Negative.  Negative for chills and fever.  HENT: Negative.  Negative for congestion and sore throat.   Respiratory: Negative.   Negative for cough and shortness of breath.   Cardiovascular: Negative.  Negative for chest pain and palpitations.  Gastrointestinal:  Positive for abdominal pain. Negative for blood in stool, constipation, diarrhea, nausea and vomiting.  Genitourinary: Negative.  Negative for dysuria and hematuria.  Skin: Negative.  Negative for rash.  Neurological: Negative.  Negative for dizziness and headaches.  All other systems reviewed and are negative.   Vitals:   10/18/23 1116  BP: 136/84  Pulse: 80  Temp: 98.4 F (36.9 C)  SpO2: 96%    Physical Exam Vitals reviewed.  Constitutional:      Appearance: She is well-developed.  HENT:     Head: Normocephalic.     Mouth/Throat:     Mouth: Mucous membranes are moist.     Pharynx: Oropharynx is clear.  Eyes:     Extraocular Movements: Extraocular movements intact.     Conjunctiva/sclera: Conjunctivae normal.     Pupils: Pupils are equal, round, and reactive to light.  Cardiovascular:     Rate and Rhythm: Normal rate and regular rhythm.     Pulses: Normal pulses.     Heart sounds: Normal heart sounds.  Pulmonary:     Effort: Pulmonary effort is normal.     Breath sounds: Normal breath sounds.  Abdominal:     General: There is no distension.     Palpations: Abdomen is soft.     Tenderness: There is abdominal tenderness (Left abdomen). There is no guarding or rebound.  Musculoskeletal:     Cervical back: No tenderness.  Lymphadenopathy:     Cervical: No cervical adenopathy.  Skin:    General: Skin is warm and dry.  Neurological:     General: No focal deficit present.     Mental Status: She is alert and oriented to person, place, and time.  Psychiatric:        Mood and Affect: Mood normal.        Behavior: Behavior normal.      ASSESSMENT & PLAN: Problem List Items Addressed This Visit       Other   History of diverticulitis   Relevant Medications   amoxicillin-clavulanate (AUGMENTIN) 875-125 MG tablet   pantoprazole  (PROTONIX) 40 MG tablet   Other Relevant Orders   CBC with Differential/Platelet   Comprehensive metabolic panel   Lipase   Generalized abdominal pain - Primary    Clinically stable.  No red flag signs or symptoms. Afebrile.  Benign abdominal examination. History of diverticulosis and diverticulitis. Recommend blood work today. Differential diagnosis discussed including possibility of diverticulitis. Recommend to start Augmentin 875 mg twice a day for 7 days Also recommend pantoprazole 40 mg daily for couple weeks  Diet and nutrition discussed ED precautions given Advised to contact the office if no better or worse during the next several days      Relevant Medications   amoxicillin-clavulanate (AUGMENTIN) 875-125 MG tablet   pantoprazole (PROTONIX) 40 MG tablet   Other Relevant Orders   DG Chest 2 View   CBC with Differential/Platelet   Comprehensive metabolic panel   Lipase   Patient Instructions  Diverticulitis Diverticulitis  La diverticulitis ocurre cuando pequeas bolsas en el colon se infectan o se inflaman. Esto ocasiona dolor en el vientre (abdomen) y heces acuosas (diarrea). Las pequeas bolsas se denominan divertculos. Se pueden formar en las personas que tienen una afeccin llamada diverticulosis. Cules son las causas? Puede desarrollar esta afeccin si queda materia fecal (heces) atrapada en las bolsas del colon. La materia fecal permite el crecimiento de grmenes (bacterias). Esto causa una infeccin. Qu incrementa el riesgo? Es ms probable que contraiga esta afeccin si tiene pequeas bolsas en el colon. Tambin es ms probable que la contraiga si: Tiene sobrepeso o mucho sobrepeso (es obeso). No realiza actividad fsica suficiente. Bebe alcohol. Fuma. Come mucha carne roja, como carne de [place], cerdo o cordero. No consume suficiente fibra. Es mayor de 40 aos. Cules son los signos o sntomas? Dolor en el vientre. El dolor suele aparecer en el lado  izquierdo, pero puede manifestarse tambin en otras zonas. Grant Ruts y escalofros. Ganas de vomitar. Vmitos. Clicos. Sensacin de estar lleno. Cambios en la frecuencia de las deposiciones. Sangre en las heces. Cmo se trata? La mayora de los casos se tratan en casa. Es posible que le indiquen lo siguiente: Tomar analgsicos de Sales promotion account executive. Beber solamente lquidos claros. Tomar antibiticos. Descanso. Es posible que los casos muy graves deban tratarse en el hospital. El tratamiento puede incluir: No comer ni beber nada. Tomar analgsicos. Recibir antibiticos por va intravenosa. Recibir lquidos y alimentos a travs de una va intravenosa. Someterse a [place]. Cuando se sienta mejor, el mdico puede indicarle que se someta a una prueba para examinar el colon (colonoscopa). Siga estas instrucciones en su casa: Medicamentos Use los medicamentos de venta libre y los recetados solamente como se lo haya indicado el mdico. Esto incluye lo siguiente: Pastillas de [place]. Probiticos. Medicamentos para ablandar las heces (laxantes). Si le recetaron antibiticos, tmelos como se lo haya indicado el mdico. No deje de tomarlos aunque comience a sentirse mejor. Pregunte al mdico si debe evitar conducir o Chemical engineer mquinas mientras toma los medicamentos. Comida y bebida  Siga la dieta como se lo haya indicado el mdico. Es posible que solo deba comer y beber lquidos. Cuando se sienta mejor, es posible que pueda comer ms alimentos. Quiz tambin le indiquen que coma mucha fibra. La fibra le ayuda a Advertising copywriter. Los alimentos con fibra incluyen bayas, frijoles, lentejas y verduras verdes. Trate de no comer carne roja. Instrucciones generales No fume ni consuma ningn producto que contenga nicotina o tabaco. Si necesita ayuda para dejar de fumar, consulte al mdico. Haga ejercicio 3 o ms veces por semana. Trate de hacer 30 minutos cada vez. Ejerctese lo suficiente como para transpirar y  Environmental education officer los latidos cardacos. Comunquese con un mdico si: El Product/process development scientist. No defeca como lo hace normalmente. Los sntomas no mejoran. Los sntomas empeoran con gran rapidez. Tiene fiebre. Vomita ms de una vez. Sus heces tienen las siguientes caractersticas: Paramedic. Son de color negro. Son alquitranadas. Esta informacin no tiene Theme park manager el consejo del mdico. Asegrese de hacerle al  mdico cualquier pregunta que tenga. Document Revised: 09/21/2022 Document Reviewed: 09/21/2022 Elsevier Patient Education  2024 Elsevier Inc.     Edwina Barth, MD [facility]

## 2023-10-18 NOTE — Patient Instructions (Signed)
Diverticulitis Diverticulitis  La diverticulitis ocurre cuando pequeas bolsas en el colon se infectan o se inflaman. Esto ocasiona dolor en el vientre (abdomen) y heces acuosas (diarrea). Las pequeas bolsas se denominan divertculos. Se pueden formar en las personas que tienen una afeccin llamada diverticulosis. Cules son las causas? Puede desarrollar esta afeccin si queda materia fecal (heces) atrapada en las bolsas del colon. La materia fecal permite el crecimiento de grmenes (bacterias). Esto causa una infeccin. Qu incrementa el riesgo? Es ms probable que contraiga esta afeccin si tiene pequeas bolsas en el colon. Tambin es ms probable que la contraiga si: Tiene sobrepeso o mucho sobrepeso (es obeso). No realiza actividad fsica suficiente. Bebe alcohol. Fuma. Come mucha carne roja, como carne de [place], cerdo o cordero. No consume suficiente fibra. Es mayor de 40 aos. Cules son los signos o sntomas? Dolor en el vientre. El dolor suele aparecer en el lado izquierdo, pero puede manifestarse tambin en otras zonas. Grant Ruts y escalofros. Ganas de vomitar. Vmitos. Clicos. Sensacin de estar lleno. Cambios en la frecuencia de las deposiciones. Sangre en las heces. Cmo se trata? La mayora de los casos se tratan en casa. Es posible que le indiquen lo siguiente: Tomar analgsicos de Sales promotion account executive. Beber solamente lquidos claros. Tomar antibiticos. Descanso. Es posible que los casos muy graves deban tratarse en el hospital. El tratamiento puede incluir: No comer ni beber nada. Tomar analgsicos. Recibir antibiticos por va intravenosa. Recibir lquidos y alimentos a travs de una va intravenosa. Someterse a [place]. Cuando se sienta mejor, el mdico puede indicarle que se someta a una prueba para examinar el colon (colonoscopa). Siga estas instrucciones en su casa: Medicamentos Use los medicamentos de venta libre y los recetados solamente como se lo haya  indicado el mdico. Esto incluye lo siguiente: Pastillas de [place]. Probiticos. Medicamentos para ablandar las heces (laxantes). Si le recetaron antibiticos, tmelos como se lo haya indicado el mdico. No deje de tomarlos aunque comience a sentirse mejor. Pregunte al mdico si debe evitar conducir o Chemical engineer mquinas mientras toma los medicamentos. Comida y bebida  Siga la dieta como se lo haya indicado el mdico. Es posible que solo deba comer y beber lquidos. Cuando se sienta mejor, es posible que pueda comer ms alimentos. Quiz tambin le indiquen que coma mucha fibra. La fibra le ayuda a Advertising copywriter. Los alimentos con fibra incluyen bayas, frijoles, lentejas y verduras verdes. Trate de no comer carne roja. Instrucciones generales No fume ni consuma ningn producto que contenga nicotina o tabaco. Si necesita ayuda para dejar de fumar, consulte al mdico. Haga ejercicio 3 o ms veces por semana. Trate de hacer 30 minutos cada vez. Ejerctese lo suficiente como para transpirar y Environmental education officer los latidos cardacos. Comunquese con un mdico si: El Product/process development scientist. No defeca como lo hace normalmente. Los sntomas no mejoran. Los sntomas empeoran con gran rapidez. Tiene fiebre. Vomita ms de una vez. Sus heces tienen las siguientes caractersticas: Paramedic. Son de color negro. Son alquitranadas. Esta informacin no tiene Theme park manager el consejo del mdico. Asegrese de hacerle al mdico cualquier pregunta que tenga. Document Revised: 09/21/2022 Document Reviewed: 09/21/2022 Elsevier Patient Education  2024 [employer].

## 2023-10-18 NOTE — Assessment & Plan Note (Signed)
Clinically stable.  No red flag signs or symptoms. Afebrile.  Benign abdominal examination. History of diverticulosis and diverticulitis. Recommend blood work today. Differential diagnosis discussed including possibility of diverticulitis. Recommend to start Augmentin 875 mg twice a day for 7 days Also recommend pantoprazole 40 mg daily for couple weeks Diet and nutrition discussed ED precautions given Advised to contact the office if no better or worse during the next several days

## 2023-10-25 ENCOUNTER — Encounter: Payer: Self-pay | Admitting: Emergency Medicine

## 2023-10-25 ENCOUNTER — Telehealth: Payer: Self-pay | Admitting: Emergency Medicine

## 2023-10-25 NOTE — Telephone Encounter (Signed)
Patient returned a call about her recent lab results. She would like a call back at [phone].

## 2023-10-25 NOTE — Telephone Encounter (Signed)
Stop antibiotic and monitor symptoms.  Thanks.

## 2023-10-29 ENCOUNTER — Encounter: Payer: Self-pay | Admitting: Radiology

## 2023-10-29 NOTE — Telephone Encounter (Signed)
If she is still symptomatic, she needs to be reevaluated in the office.  Her labs looked normal.  Thanks.

## 2023-10-31 NOTE — Telephone Encounter (Signed)
Tried calling both numbers on patients chart and left a MyChart message for patient. not able to LVM patient needs appointment if she is still having pain.

## 2024-01-30 ENCOUNTER — Ambulatory Visit
Admission: EM | Admit: 2024-01-30 | Discharge: 2024-01-30 | Disposition: A | Discharge status: Home / Self Care | Payer: Self-pay | Attending: Family Medicine | Admitting: Family Medicine

## 2024-01-30 ENCOUNTER — Other Ambulatory Visit: Payer: Self-pay

## 2024-01-30 DIAGNOSIS — R040 Epistaxis: Secondary | ICD-10-CM

## 2024-01-30 NOTE — ED Triage Notes (Signed)
 Translator used for clinical intake: Aurea Graff # [id number]  Pt presents with complaints of blood in nose and mouth upon wakening this morning, 2/26. Pt states "this morning, I spit out about three blood clots." Pt currently denies pain. Symptoms have now resolved. No medications taken today.

## 2024-01-30 NOTE — ED Provider Notes (Signed)
 [facility]    CSN: [id number] Arrival date & time: 01/30/24  1004      History   Chief Complaint Chief Complaint  Patient presents with   Epistaxis    HPI Malanie Koloski is a 63 y.o. female.   The history is provided by the patient. A language interpreter was used.  Epistaxis Associated symptoms: no congestion, no dizziness and no headaches   Interpretor Aurea Graff [phone] Nosebleed this morning from right nare, states when she got up she spit up 2 or 3 clots of blood then had bleeding from right nare which lasted about 20 minutes.  She treated it with holding pressure to that side.  Has had nosebleeds frequently 2 times a month for many months.  Denies recent injury.  Denies use of blood thinners  Past Medical History:  Diagnosis Date   Diverticulitis    Fibroids    GERD (gastroesophageal reflux disease)    High cholesterol    Hyperlipidemia    Hypertension     Patient Active Problem List   Diagnosis Date Noted   Generalized abdominal pain 10/18/2023   History of diverticulitis 11/22/2022   History of uterine fibroid 05/29/2017   Menopause 05/29/2017   Diverticulosis 02/18/2016   Dyslipidemia 06/25/2015   Prediabetes 06/25/2015   Primary hypertension 04/17/2012    Past Surgical History:  Procedure Laterality Date   BREAST BIOPSY Right 01/06/2004   HERNIA REPAIR     TUBAL LIGATION      OB History     Gravida  5   Para  3   Term  3   Preterm  0   AB  2   Living  3      SAB  2   IAB  0   Ectopic  0   Multiple      Live Births               Home Medications    Prior to Admission medications   Medication Sig Start Date End Date Taking? Authorizing Provider  amLODipine (NORVASC) 5 MG tablet Take 1 tablet (5 mg total) by mouth daily. 07/06/23   Georgina Quint, MD  Cyanocobalamin (B-12 PO) Take by mouth. Patient not taking: Reported on 07/11/2023    [provider]  ibuprofen (ADVIL) 600 MG tablet Take  1 tablet (600 mg total) by mouth every 6 (six) hours as needed. Patient not taking: Reported on 07/11/2023 03/24/23   Merrilee Jansky, MD  pantoprazole (PROTONIX) 40 MG tablet Take 1 tablet (40 mg total) by mouth daily. 10/18/23   Georgina Quint, MD  rosuvastatin (CRESTOR) 10 MG tablet Take 1 tablet (10 mg total) by mouth daily. 07/12/23   Georgina Quint, MD    Family History Family History  Problem Relation Age of Onset   Stomach cancer Mother    Pneumonia Father    Diabetes Sister    Hypertension Sister    Hyperlipidemia Sister    Hypertension Sister    Hyperlipidemia Brother    Diabetes Brother     Social History Social History   Tobacco Use   Smoking status: Never   Smokeless tobacco: Never  Vaping Use   Vaping status: Never Used  Substance Use Topics   Alcohol use: No   Drug use: No     Allergies   Ace inhibitors   Review of Systems Review of Systems  HENT:  Positive for nosebleeds. Negative for congestion.   Genitourinary:  Negative  for hematuria.  Neurological:  Negative for dizziness, light-headedness and headaches.     Physical Exam Triage Vital Signs ED Triage Vitals  Encounter Vitals Group     BP 01/30/24 1020 (!) 146/84     Systolic BP Percentile --      Diastolic BP Percentile --      Pulse Rate 01/30/24 1020 97     Resp 01/30/24 1020 18     Temp 01/30/24 1020 (!) 97.4 F (36.3 C)     Temp Source 01/30/24 1020 Oral     SpO2 01/30/24 1020 96 %     Weight 01/30/24 1023 180 lb (81.6 kg)     Height 01/30/24 1023 5\' 2"  (1.575 m)     Head Circumference --      Peak Flow --      Pain Score 01/30/24 1023 0     Pain Loc --      Pain Education --      Exclude from Growth Chart --    No data found.  Updated Vital Signs BP (!) 146/84 (BP Location: Right Arm)   Pulse 97   Temp (!) 97.4 F (36.3 C) (Oral)   Resp 18   Ht 5\' 2"  (1.575 m)   Wt 180 lb (81.6 kg)   LMP 05/08/2017 (Approximate)   SpO2 96%   BMI 32.92 kg/m   Visual  Acuity Right Eye Distance:   Left Eye Distance:   Bilateral Distance:    Right Eye Near:   Left Eye Near:    Bilateral Near:     Physical Exam Vitals reviewed.  Constitutional:      Appearance: Normal appearance.  HENT:     Head: Normocephalic and atraumatic.     Right Ear: Tympanic membrane and ear canal normal.     Left Ear: Tympanic membrane and ear canal normal.     Nose: Laceration present. No nasal deformity or rhinorrhea.     Right Nostril: No foreign body, epistaxis, septal hematoma or occlusion.     Left Nostril: No foreign body, epistaxis, septal hematoma or occlusion.     Comments: Right nare friable mucosa no active bleeding left nare normal    Mouth/Throat:     Mouth: Mucous membranes are moist.  Cardiovascular:     Rate and Rhythm: Normal rate.     Heart sounds: Normal heart sounds.  Pulmonary:     Effort: Pulmonary effort is normal.     Breath sounds: Normal breath sounds.  Musculoskeletal:     Cervical back: Neck supple.  Neurological:     Mental Status: She is alert and oriented to person, place, and time.  Psychiatric:        Mood and Affect: Mood normal.      UC Treatments / Results  Labs (all labs ordered are listed, but only abnormal results are displayed) Labs Reviewed - No data to display  EKG   Radiology No results found.  Procedures Procedures (including critical care time)  Medications Ordered in UC Medications - No data to display  Initial Impression / Assessment and Plan / UC Course  I have reviewed the triage vital signs and the nursing notes.  Pertinent labs & imaging results that were available during my care of the patient were reviewed by me and considered in my medical decision making (see chart for details).     Recommend saline nasal spray humidified air,Ayr gel or Vaseline to keep nasal mucosa moist Follow-up with ENT patient provided  number for ENT  Final Clinical Impressions(s) / UC Diagnoses   Final diagnoses:   None   Discharge Instructions   None    ED Prescriptions   None    PDMP not reviewed this encounter.   Meliton Rattan, [place] 01/30/24 1045

## 2024-07-23 ENCOUNTER — Encounter: Payer: Self-pay | Admitting: Emergency Medicine

## 2024-07-23 ENCOUNTER — Ambulatory Visit (INDEPENDENT_AMBULATORY_CARE_PROVIDER_SITE_OTHER): Payer: Self-pay | Admitting: Emergency Medicine

## 2024-07-23 VITALS — BP 136/92 | HR 94 | Temp 98.6°F | Ht 62.0 in | Wt 181.0 lb

## 2024-07-23 DIAGNOSIS — K5790 Diverticulosis of intestine, part unspecified, without perforation or abscess without bleeding: Secondary | ICD-10-CM

## 2024-07-23 DIAGNOSIS — Z8719 Personal history of other diseases of the digestive system: Secondary | ICD-10-CM

## 2024-07-23 DIAGNOSIS — I10 Essential (primary) hypertension: Secondary | ICD-10-CM

## 2024-07-23 DIAGNOSIS — R1084 Generalized abdominal pain: Secondary | ICD-10-CM

## 2024-07-23 DIAGNOSIS — R7303 Prediabetes: Secondary | ICD-10-CM

## 2024-07-23 DIAGNOSIS — E785 Hyperlipidemia, unspecified: Secondary | ICD-10-CM

## 2024-07-23 LAB — URINALYSIS, ROUTINE W REFLEX MICROSCOPIC
Bilirubin Urine: NEGATIVE
Ketones, ur: NEGATIVE
Nitrite: NEGATIVE
Specific Gravity, Urine: <=1.005 — AB (ref 1.000–1.030)
Total Protein, Urine: NEGATIVE
Urine Glucose: NEGATIVE
Urobilinogen, UA: 0.2 (ref 0.0–1.0)
pH: 6 (ref 5.0–8.0)

## 2024-07-23 LAB — CBC WITH DIFFERENTIAL/PLATELET
Basophils Absolute: 0.1 K/uL (ref 0.0–0.1)
Basophils Relative: 1.1 % (ref 0.0–3.0)
Eosinophils Absolute: 0.3 K/uL (ref 0.0–0.7)
Eosinophils Relative: 3.3 % (ref 0.0–5.0)
HCT: 39.8 % (ref 36.0–46.0)
Hemoglobin: 13.4 g/dL (ref 12.0–15.0)
Lymphocytes Relative: 32.2 % (ref 12.0–46.0)
Lymphs Abs: 3 K/uL (ref 0.7–4.0)
MCHC: 33.7 g/dL (ref 30.0–36.0)
MCV: 86.4 fl (ref 78.0–100.0)
Monocytes Absolute: 0.7 K/uL (ref 0.1–1.0)
Monocytes Relative: 8.1 % (ref 3.0–12.0)
Neutro Abs: 5.1 K/uL (ref 1.4–7.7)
Neutrophils Relative %: 55.3 % (ref 43.0–77.0)
Platelets: 316 K/uL (ref 150.0–400.0)
RBC: 4.61 Mil/uL (ref 3.87–5.11)
RDW: 13.9 % (ref 11.5–15.5)
WBC: 9.2 K/uL (ref 4.0–10.5)

## 2024-07-23 LAB — COMPREHENSIVE METABOLIC PANEL WITH GFR
ALT: 19 U/L (ref 0–35)
AST: 16 U/L (ref 0–37)
Albumin: 4.4 g/dL (ref 3.5–5.2)
Alkaline Phosphatase: 104 U/L (ref 39–117)
BUN: 12 mg/dL (ref 6–23)
CO2: 29 meq/L (ref 19–32)
Calcium: 9.5 mg/dL (ref 8.4–10.5)
Chloride: 104 meq/L (ref 96–112)
Creatinine, Ser: 0.59 mg/dL (ref 0.40–1.20)
GFR: 95.61 mL/min (ref >60.00)
Glucose, Bld: 85 mg/dL (ref 70–99)
Potassium: 4.3 meq/L (ref 3.5–5.1)
Sodium: 139 meq/L (ref 135–145)
Total Bilirubin: 0.4 mg/dL (ref 0.2–1.2)
Total Protein: 8 g/dL (ref 6.0–8.3)

## 2024-07-23 LAB — LIPASE: Lipase: 26 U/L (ref 11.0–59.0)

## 2024-07-23 MED ORDER — PANTOPRAZOLE SODIUM 40 MG PO TBEC
40.0000 mg | DELAYED_RELEASE_TABLET | Freq: Every day | ORAL | 3 refills | Status: AC
Start: 2024-07-23 — End: ?

## 2024-07-23 MED ORDER — ROSUVASTATIN CALCIUM 10 MG PO TABS
10.0000 mg | ORAL_TABLET | Freq: Every day | ORAL | 3 refills | Status: AC
Start: 2024-07-23 — End: ?

## 2024-07-23 MED ORDER — AMLODIPINE BESYLATE 5 MG PO TABS
5.0000 mg | ORAL_TABLET | Freq: Every day | ORAL | 3 refills | Status: AC
Start: 2024-07-23 — End: ?

## 2024-07-23 NOTE — Assessment & Plan Note (Signed)
 Stable.  Diet and nutrition discussed.  Lipid profile done today. Continue atorvastatin  20 mg daily.

## 2024-07-23 NOTE — Progress Notes (Signed)
 Sheila [name] 63 y.o.   Chief Complaint  Patient presents with   Follow-up    Patient here for f/u for her abd pain. Patient states her stomach issues are still the same. She is not taking anything for the pain. Patient mentions that she has not taken her bp meds for about 2 weeks     HISTORY OF PRESENT ILLNESS: This is a 63 y.o. female complaining of chronic mostly left-sided abdominal pain on and off for the past 7 to 8 months Occasional nausea but no vomiting.  Denies fever or chills.  Denies rectal bleeding Occasional constipation No other associated symptoms No other complaints or medical concerns today.  HPI   Prior to Admission medications   Medication Sig Start Date End Date Taking? Authorizing Provider  amLODipine  (NORVASC ) 5 MG tablet Take 1 tablet (5 mg total) by mouth daily. 07/23/24   Purcell Emil Schanz, MD  Cyanocobalamin (B-12 PO) Take by mouth. Patient not taking: Reported on 07/23/2024    [provider]  ibuprofen  (ADVIL ) 600 MG tablet Take 1 tablet (600 mg total) by mouth every 6 (six) hours as needed. Patient not taking: Reported on 07/23/2024 03/24/23   Blaise Aleene KIDD, MD  pantoprazole  (PROTONIX ) 40 MG tablet Take 1 tablet (40 mg total) by mouth daily. 07/23/24   Purcell Emil Schanz, MD  rosuvastatin  (CRESTOR ) 10 MG tablet Take 1 tablet (10 mg total) by mouth daily. 07/23/24   Purcell Emil Schanz, MD    Allergies  Allergen Reactions   Ace Inhibitors Cough    Patient Active Problem List   Diagnosis Date Noted   Generalized abdominal pain 10/18/2023   History of diverticulitis 11/22/2022   History of uterine fibroid 05/29/2017   Menopause 05/29/2017   Diverticulosis 02/18/2016   Dyslipidemia 06/25/2015   Prediabetes 06/25/2015   Primary hypertension 04/17/2012    Past Medical History:  Diagnosis Date   Diverticulitis    Fibroids    GERD (gastroesophageal reflux disease)    High cholesterol    Hyperlipidemia     Hypertension     Past Surgical History:  Procedure Laterality Date   BREAST BIOPSY Right 01/06/2004   HERNIA REPAIR     TUBAL LIGATION      Social History   Socioeconomic History   Marital status: Married    Spouse name: Not on file   Number of children: Not on file   Years of education: Not on file   Highest education level: Not on file  Occupational History   Not on file  Tobacco Use   Smoking status: Never   Smokeless tobacco: Never  Vaping Use   Vaping status: Never Used  Substance and Sexual Activity   Alcohol use: No   Drug use: No   Sexual activity: Yes    Partners: Male    Birth control/protection: Surgical    Comment: 1st intercourse- 17, partners- 2, btl  Other Topics Concern   Not on file  Social History Narrative   ** Merged History Encounter **       ** Merged History Encounter **       Social Drivers of Corporate investment banker Strain: Not on file  Food Insecurity: No Food Insecurity (11/07/2022)   Hunger Vital Sign    Worried About Running Out of Food in the Last Year: Never true    Ran Out of Food in the Last Year: Never true  Transportation Needs: No Transportation Needs (11/07/2022)   PRAPARE - Transportation  Lack of Transportation (Medical): No    Lack of Transportation (Non-Medical): No  Physical Activity: Not on file  Stress: Not on file  Social Connections: Not on file  Intimate Partner Violence: Not on file    Family History  Problem Relation Age of Onset   Stomach cancer Mother    Pneumonia Father    Diabetes Sister    Hypertension Sister    Hyperlipidemia Sister    Hypertension Sister    Hyperlipidemia Brother    Diabetes Brother      Review of Systems  Constitutional: Negative.  Negative for chills and fever.  HENT: Negative.  Negative for congestion and sore throat.   Respiratory: Negative.  Negative for cough and shortness of breath.   Cardiovascular: Negative.  Negative for chest pain and palpitations.   Gastrointestinal:  Positive for abdominal pain, constipation and nausea. Negative for blood in stool, diarrhea, melena and vomiting.  Genitourinary: Negative.  Negative for dysuria and hematuria.  Skin: Negative.  Negative for rash.  Neurological: Negative.  Negative for dizziness and headaches.  All other systems reviewed and are negative.   Vitals:   07/23/24 1504  BP: (!) 136/92  Pulse: 94  Temp: 98.6 F (37 C)  SpO2: 96%    Physical Exam Vitals reviewed.  Constitutional:      Appearance: Normal appearance.  HENT:     Head: Normocephalic.     Mouth/Throat:     Mouth: Mucous membranes are moist.     Pharynx: Oropharynx is clear.  Eyes:     Extraocular Movements: Extraocular movements intact.     Pupils: Pupils are equal, round, and reactive to light.  Cardiovascular:     Rate and Rhythm: Normal rate and regular rhythm.     Pulses: Normal pulses.     Heart sounds: Normal heart sounds.  Pulmonary:     Effort: Pulmonary effort is normal.     Breath sounds: Normal breath sounds.  Abdominal:     Palpations: Abdomen is soft.     Tenderness: There is no abdominal tenderness.  Musculoskeletal:     Cervical back: No tenderness.  Lymphadenopathy:     Cervical: No cervical adenopathy.  Skin:    General: Skin is warm and dry.     Capillary Refill: Capillary refill takes less than 2 seconds.  Neurological:     General: No focal deficit present.     Mental Status: She is alert and oriented to person, place, and time.  Psychiatric:        Mood and Affect: Mood normal.        Behavior: Behavior normal.      ASSESSMENT & PLAN: Problem List Items Addressed This Visit       Cardiovascular and Mediastinum   Primary hypertension   BP Readings from Last 3 Encounters:  07/23/24 (!) 136/92  01/30/24 (!) 146/84  10/18/23 136/84  Well controlled hypertension with normal blood pressure readings at home Continue amlodipine  5 mg daily       Relevant Medications    amLODipine  (NORVASC ) 5 MG tablet   rosuvastatin  (CRESTOR ) 10 MG tablet     Digestive   Diverticulosis   Diet and nutrition discussed Advised to increase amount of fiber in her daily diet        Other   Dyslipidemia   Stable.  Diet and nutrition discussed.  Lipid profile done today. Continue atorvastatin  20 mg daily.      Relevant Medications   rosuvastatin  (CRESTOR ) 10  MG tablet   Prediabetes   Stable.  Diet and nutrition discussed.      History of diverticulitis   Relevant Medications   pantoprazole  (PROTONIX ) 40 MG tablet   Generalized abdominal pain - Primary   Clinically stable.  No red flag signs or symptoms. Afebrile.  Benign abdominal examination. History of diverticulosis and diverticulitis. Recommend blood work today. Differential diagnosis discussed Recommend GI evaluation. Referral placed today Also recommend pantoprazole  40 mg daily for couple weeks Diet and nutrition discussed ED precautions given Advised to contact the office if no better or worse during the next several days      Relevant Medications   pantoprazole  (PROTONIX ) 40 MG tablet   Other Relevant Orders   Lipase   Comprehensive metabolic panel with GFR   CBC with Differential/Platelet   Ambulatory referral to Gastroenterology   Urinalysis   Other Visit Diagnoses       Essential hypertension       Relevant Medications   amLODipine  (NORVASC ) 5 MG tablet   rosuvastatin  (CRESTOR ) 10 MG tablet      Patient Instructions  Dolor abdominal en los adultos Abdominal Pain, Adult  El dolor de [place] (abdominal) puede tener muchas causas. En la [employer], el dolor de [place] no es un problema grave y puede controlarse y Scientist, clinical (histocompatibility and immunogenetics). Pero en [employer], puede ser grave. El mdico intentar descubrir la causa del dolor de [place]. Siga estas instrucciones en su casa: Medicamentos [employer] de venta libre y los recetados solamente como se lo haya indicado  el mdico. No tome medicamentos que lo ayuden a Advertising copywriter (laxantes), salvo que el mdico se lo indique. Instrucciones generales Est atento al dolor de estmago para Insurance risk surveyor cambio. Informe al mdico si el dolor empeora. Beber suficiente lquido para Radio producer pis (orina) de color amarillo plido. Comunquese con un mdico si: El dolor de [address] cambia o [place]. Tiene clicos muy intensos o mucha distensin en el vientre. Vomita. El dolor empeora con las comidas, despus de comer o con determinados alimentos. Tiene dificultades para defecar o produce heces lquidas durante ms de 2 o 3 das. No tiene apetito o baja de peso sin proponrselo. Presenta signos de no tener suficientes lquido o agua en el cuerpo (deshidratacin). Pueden incluir: [place], muy escasa o falta de comoros. Labios agrietados o Building surveyor. Somnolencia o debilidad. Siente dolor al orinar o defecar. El dolor de estmago lo despierta de noche. Observa sangre en la orina. Tiene fiebre. Solicite ayuda de inmediato si: No puede dejar de vomitar. Siente Chief Technology Officer en una sola parte del vientre, por ejemplo, en el lado derecho. Tiene heces con sangre, de color negro o con aspecto alquitranado. Tiene dificultad para respirar. Tiene dolor en el pecho. Estos sntomas pueden Customer service manager. Solicite ayuda de inmediato. Llame al 911. No espere a ver si los sntomas desaparecen. No conduzca por sus propios medios [employer]. Esta informacin no tiene Theme park manager el consejo del mdico. Asegrese de hacerle al mdico cualquier pregunta que tenga. Document Revised: 12/30/2022 Document Reviewed: 12/30/2022 Elsevier Patient Education  2024 Elsevier Inc.     Emil Schaumann, MD [facility]

## 2024-07-23 NOTE — Assessment & Plan Note (Signed)
 BP Readings from Last 3 Encounters:  07/23/24 (!) 136/92  01/30/24 (!) 146/84  10/18/23 136/84  Well controlled hypertension with normal blood pressure readings at home Continue amlodipine  5 mg daily

## 2024-07-23 NOTE — Assessment & Plan Note (Signed)
Stable.  Diet and nutrition discussed. 

## 2024-07-23 NOTE — Patient Instructions (Signed)
 Dolor abdominal en los adultos Abdominal Pain, Adult  El dolor de [place] (abdominal) puede tener muchas causas. En la [employer], el dolor de [place] no es un problema grave y puede controlarse y Scientist, clinical (histocompatibility and immunogenetics). Pero en [employer], puede ser grave. El mdico intentar descubrir la causa del dolor de [place]. Siga estas instrucciones en su casa: Medicamentos [employer] de venta libre y los recetados solamente como se lo haya indicado el mdico. No tome medicamentos que lo ayuden a Advertising copywriter (laxantes), salvo que el mdico se lo indique. Instrucciones generales Est atento al dolor de estmago para Insurance risk surveyor cambio. Informe al mdico si el dolor empeora. Beber suficiente lquido para Radio producer pis (orina) de color amarillo plido. Comunquese con un mdico si: El dolor de [address] cambia o [place]. Tiene clicos muy intensos o mucha distensin en el vientre. Vomita. El dolor empeora con las comidas, despus de comer o con determinados alimentos. Tiene dificultades para defecar o produce heces lquidas durante ms de 2 o 3 das. No tiene apetito o baja de peso sin proponrselo. Presenta signos de no tener suficientes lquido o agua en el cuerpo (deshidratacin). Pueden incluir: [place], muy escasa o falta de [place]. Labios agrietados o Building surveyor. Somnolencia o debilidad. Siente dolor al orinar o defecar. El dolor de estmago lo despierta de noche. Observa sangre en la orina. Tiene fiebre. Solicite ayuda de inmediato si: No puede dejar de vomitar. Siente Chief Technology Officer en una sola parte del vientre, por ejemplo, en el lado derecho. Tiene heces con sangre, de color negro o con aspecto alquitranado. Tiene dificultad para respirar. Tiene dolor en el pecho. Estos sntomas pueden Customer service manager. Solicite ayuda de inmediato. Llame al 911. No espere a ver si los sntomas desaparecen. No conduzca por sus propios medios [employer]. Esta  informacin no tiene Theme park manager el consejo del mdico. Asegrese de hacerle al mdico cualquier pregunta que tenga. Document Revised: 12/30/2022 Document Reviewed: 12/30/2022 Elsevier Patient Education  2024 [employer].

## 2024-07-23 NOTE — Assessment & Plan Note (Signed)
 Diet and nutrition discussed Advised to increase amount of fiber in her daily diet

## 2024-07-23 NOTE — Assessment & Plan Note (Signed)
 Clinically stable.  No red flag signs or symptoms. Afebrile.  Benign abdominal examination. History of diverticulosis and diverticulitis. Recommend blood work today. Differential diagnosis discussed Recommend GI evaluation. Referral placed today Also recommend pantoprazole  40 mg daily for couple weeks Diet and nutrition discussed ED precautions given Advised to contact the office if no better or worse during the next several days

## 2024-07-25 ENCOUNTER — Ambulatory Visit: Payer: Self-pay | Admitting: Emergency Medicine

## 2024-07-25 DIAGNOSIS — N3000 Acute cystitis without hematuria: Secondary | ICD-10-CM

## 2024-07-25 MED ORDER — AMOXICILLIN-POT CLAVULANATE 875-125 MG PO TABS
1.0000 | ORAL_TABLET | Freq: Two times a day (BID) | ORAL | 0 refills | Status: AC
Start: 2024-07-25 — End: 2024-08-01

## 2024-10-06 ENCOUNTER — Encounter: Payer: Self-pay | Admitting: Emergency Medicine
